# Patient Record
Sex: Female | Born: 1990 | Race: Black or African American | Hispanic: No | Marital: Single | Smoking: Never smoker
Health system: Southern US, Community
[De-identification: ages and names within clinical notes are randomized; demographics above are authoritative.]

## PROBLEM LIST (undated history)

## (undated) ENCOUNTER — Inpatient Hospital Stay (HOSPITAL_COMMUNITY): Payer: Self-pay

## (undated) DIAGNOSIS — E669 Obesity, unspecified: Secondary | ICD-10-CM

## (undated) DIAGNOSIS — N760 Acute vaginitis: Secondary | ICD-10-CM

## (undated) DIAGNOSIS — N83209 Unspecified ovarian cyst, unspecified side: Secondary | ICD-10-CM

## (undated) DIAGNOSIS — F32A Depression, unspecified: Secondary | ICD-10-CM

## (undated) DIAGNOSIS — B9689 Other specified bacterial agents as the cause of diseases classified elsewhere: Secondary | ICD-10-CM

## (undated) DIAGNOSIS — N390 Urinary tract infection, site not specified: Secondary | ICD-10-CM

## (undated) DIAGNOSIS — A549 Gonococcal infection, unspecified: Secondary | ICD-10-CM

## (undated) DIAGNOSIS — B999 Unspecified infectious disease: Secondary | ICD-10-CM

## (undated) DIAGNOSIS — A599 Trichomoniasis, unspecified: Secondary | ICD-10-CM

## (undated) DIAGNOSIS — F329 Major depressive disorder, single episode, unspecified: Secondary | ICD-10-CM

## (undated) DIAGNOSIS — L309 Dermatitis, unspecified: Secondary | ICD-10-CM

## (undated) DIAGNOSIS — A6000 Herpesviral infection of urogenital system, unspecified: Secondary | ICD-10-CM

## (undated) DIAGNOSIS — O009 Unspecified ectopic pregnancy without intrauterine pregnancy: Secondary | ICD-10-CM

## (undated) DIAGNOSIS — A609 Anogenital herpesviral infection, unspecified: Secondary | ICD-10-CM

## (undated) HISTORY — PX: UNILATERAL SALPINGECTOMY: SHX6160

## (undated) HISTORY — PX: THERAPEUTIC ABORTION: SHX798

## (undated) HISTORY — DX: Obesity, unspecified: E66.9

---
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
## 2009-04-08 ENCOUNTER — Emergency Department (HOSPITAL_COMMUNITY): Admission: EM | Admit: 2009-04-08 | Discharge: 2009-04-08 | Payer: Self-pay | Admitting: Family Medicine

## 2009-09-02 ENCOUNTER — Inpatient Hospital Stay (HOSPITAL_COMMUNITY): Admission: AD | Admit: 2009-09-02 | Discharge: 2009-09-02 | Payer: Self-pay | Admitting: Obstetrics and Gynecology

## 2009-09-03 ENCOUNTER — Emergency Department (HOSPITAL_COMMUNITY): Admission: EM | Admit: 2009-09-03 | Discharge: 2009-09-03 | Payer: Self-pay | Admitting: Emergency Medicine

## 2009-09-03 ENCOUNTER — Encounter: Payer: Self-pay | Admitting: Obstetrics and Gynecology

## 2010-01-04 ENCOUNTER — Inpatient Hospital Stay (HOSPITAL_COMMUNITY): Admission: AD | Admit: 2010-01-04 | Discharge: 2010-01-08 | Payer: Self-pay | Admitting: Obstetrics and Gynecology

## 2010-09-04 ENCOUNTER — Ambulatory Visit: Payer: Self-pay | Admitting: Nurse Practitioner

## 2010-09-04 ENCOUNTER — Inpatient Hospital Stay (HOSPITAL_COMMUNITY): Admission: AD | Admit: 2010-09-04 | Discharge: 2010-09-04 | Payer: Self-pay | Admitting: Obstetrics and Gynecology

## 2010-09-11 ENCOUNTER — Inpatient Hospital Stay (HOSPITAL_COMMUNITY): Admission: AD | Admit: 2010-09-11 | Discharge: 2010-09-12 | Payer: Self-pay | Admitting: Obstetrics & Gynecology

## 2010-09-12 ENCOUNTER — Ambulatory Visit: Payer: Self-pay | Admitting: Gynecology

## 2010-09-12 ENCOUNTER — Inpatient Hospital Stay (HOSPITAL_COMMUNITY): Admission: AD | Admit: 2010-09-12 | Discharge: 2010-09-12 | Payer: Self-pay | Admitting: Obstetrics and Gynecology

## 2011-03-10 LAB — CBC
HCT: 37.1 % (ref 36.0–46.0)
Hemoglobin: 12.7 g/dL (ref 12.0–15.0)
MCHC: 34.3 g/dL (ref 30.0–36.0)
MCV: 89.4 fL (ref 78.0–100.0)
RBC: 4.15 MIL/uL (ref 3.87–5.11)
RDW: 13.1 % (ref 11.5–15.5)
WBC: 10 10*3/uL (ref 4.0–10.5)

## 2011-03-10 LAB — URINALYSIS, ROUTINE W REFLEX MICROSCOPIC
Bilirubin Urine: NEGATIVE
Hgb urine dipstick: NEGATIVE
Ketones, ur: NEGATIVE mg/dL
Nitrite: NEGATIVE
pH: 6 (ref 5.0–8.0)

## 2011-03-10 LAB — URINE MICROSCOPIC-ADD ON

## 2011-03-10 LAB — WET PREP, GENITAL
Trich, Wet Prep: NONE SEEN
Yeast Wet Prep HPF POC: NONE SEEN

## 2011-03-10 LAB — GC/CHLAMYDIA PROBE AMP, GENITAL: GC Probe Amp, Genital: NEGATIVE

## 2011-03-10 LAB — POCT PREGNANCY, URINE
Preg Test, Ur: NEGATIVE
Preg Test, Ur: NEGATIVE

## 2011-03-13 LAB — COMPREHENSIVE METABOLIC PANEL
ALT: 22 U/L (ref 0–35)
AST: 22 U/L (ref 0–37)
Alkaline Phosphatase: 137 U/L — ABNORMAL HIGH (ref 39–117)
CO2: 24 mEq/L (ref 19–32)
Calcium: 9.3 mg/dL (ref 8.4–10.5)
Chloride: 105 mEq/L (ref 96–112)
GFR calc Af Amer: >60 mL/min (ref >60)
Glucose, Bld: 75 mg/dL (ref 70–99)
Potassium: 4.4 mEq/L (ref 3.5–5.1)
Total Bilirubin: 0.5 mg/dL (ref 0.3–1.2)
Total Protein: 6.1 g/dL (ref 6.0–8.3)

## 2011-03-13 LAB — CBC
MCHC: 33.9 g/dL (ref 30.0–36.0)
Platelets: 189 10*3/uL (ref 150–400)
Platelets: 230 10*3/uL (ref 150–400)
RDW: 13.9 % (ref 11.5–15.5)

## 2011-03-13 LAB — LACTATE DEHYDROGENASE: LDH: 145 U/L (ref 94–250)

## 2011-03-13 LAB — RPR: RPR Ser Ql: NONREACTIVE

## 2011-03-22 ENCOUNTER — Observation Stay (HOSPITAL_COMMUNITY)
Admission: EM | Admit: 2011-03-22 | Discharge: 2011-03-23 | Disposition: A | Discharge status: Home / Self Care | Payer: Self-pay | Attending: Emergency Medicine | Admitting: Emergency Medicine

## 2011-03-22 ENCOUNTER — Emergency Department (HOSPITAL_COMMUNITY): Payer: Self-pay

## 2011-03-22 ENCOUNTER — Encounter (HOSPITAL_COMMUNITY): Payer: Self-pay | Admitting: Radiology

## 2011-03-22 DIAGNOSIS — R110 Nausea: Secondary | ICD-10-CM | POA: Insufficient documentation

## 2011-03-22 DIAGNOSIS — R197 Diarrhea, unspecified: Secondary | ICD-10-CM | POA: Insufficient documentation

## 2011-03-22 DIAGNOSIS — R509 Fever, unspecified: Secondary | ICD-10-CM | POA: Insufficient documentation

## 2011-03-22 DIAGNOSIS — R109 Unspecified abdominal pain: Secondary | ICD-10-CM | POA: Insufficient documentation

## 2011-03-22 DIAGNOSIS — N12 Tubulo-interstitial nephritis, not specified as acute or chronic: Principal | ICD-10-CM | POA: Insufficient documentation

## 2011-03-22 LAB — URINALYSIS, ROUTINE W REFLEX MICROSCOPIC
Ketones, ur: 15 mg/dL — AB
Urobilinogen, UA: 4 mg/dL — ABNORMAL HIGH (ref 0.0–1.0)

## 2011-03-22 LAB — DIFFERENTIAL
Basophils Absolute: 0 10*3/uL (ref 0.0–0.1)
Basophils Relative: 0 % (ref 0–1)
Eosinophils Absolute: 0 10*3/uL (ref 0.0–0.7)
Eosinophils Relative: 0 % (ref 0–5)
Monocytes Absolute: 1.1 10*3/uL — ABNORMAL HIGH (ref 0.1–1.0)
Monocytes Relative: 4 % (ref 3–12)
Neutrophils Relative %: 87 % — ABNORMAL HIGH (ref 43–77)

## 2011-03-22 LAB — BASIC METABOLIC PANEL
CO2: 24 mEq/L (ref 19–32)
Calcium: 9.1 mg/dL (ref 8.4–10.5)
Glucose, Bld: 102 mg/dL — ABNORMAL HIGH (ref 70–99)
Sodium: 136 mEq/L (ref 135–145)

## 2011-03-22 LAB — CBC
MCH: 30.1 pg (ref 26.0–34.0)
RBC: 4.25 MIL/uL (ref 3.87–5.11)
RDW: 13.2 % (ref 11.5–15.5)
WBC: 26.5 10*3/uL — ABNORMAL HIGH (ref 4.0–10.5)

## 2011-03-22 LAB — URINE MICROSCOPIC-ADD ON

## 2011-03-22 LAB — POCT PREGNANCY, URINE: Preg Test, Ur: NEGATIVE

## 2011-03-22 MED ORDER — IOHEXOL 300 MG/ML  SOLN
100.0000 mL | Freq: Once | INTRAMUSCULAR | Status: AC | PRN
  Administered 2011-03-22: 100 mL via INTRAVENOUS

## 2011-03-23 LAB — WET PREP, GENITAL: Clue Cells Wet Prep HPF POC: NONE SEEN

## 2011-03-24 LAB — GC/CHLAMYDIA PROBE AMP, GENITAL
Chlamydia, DNA Probe: NEGATIVE
GC Probe Amp, Genital: POSITIVE — AB

## 2011-03-26 ENCOUNTER — Emergency Department (HOSPITAL_COMMUNITY)
Admission: EM | Admit: 2011-03-26 | Discharge: 2011-03-27 | Disposition: A | Discharge status: Home / Self Care | Payer: Self-pay | Attending: Emergency Medicine | Admitting: Emergency Medicine

## 2011-03-26 DIAGNOSIS — R1084 Generalized abdominal pain: Secondary | ICD-10-CM | POA: Insufficient documentation

## 2011-03-26 DIAGNOSIS — A6000 Herpesviral infection of urogenital system, unspecified: Secondary | ICD-10-CM | POA: Insufficient documentation

## 2011-03-26 DIAGNOSIS — N949 Unspecified condition associated with female genital organs and menstrual cycle: Secondary | ICD-10-CM | POA: Insufficient documentation

## 2011-03-26 DIAGNOSIS — L293 Anogenital pruritus, unspecified: Secondary | ICD-10-CM | POA: Insufficient documentation

## 2011-03-26 DIAGNOSIS — F411 Generalized anxiety disorder: Secondary | ICD-10-CM | POA: Insufficient documentation

## 2011-03-26 DIAGNOSIS — N898 Other specified noninflammatory disorders of vagina: Secondary | ICD-10-CM | POA: Insufficient documentation

## 2011-03-26 DIAGNOSIS — R197 Diarrhea, unspecified: Secondary | ICD-10-CM | POA: Insufficient documentation

## 2011-03-26 DIAGNOSIS — R300 Dysuria: Secondary | ICD-10-CM | POA: Insufficient documentation

## 2011-03-26 LAB — URINALYSIS, ROUTINE W REFLEX MICROSCOPIC
Ketones, ur: 15 mg/dL — AB
Nitrite: NEGATIVE
Protein, ur: NEGATIVE mg/dL
Specific Gravity, Urine: 1.032 — ABNORMAL HIGH (ref 1.005–1.030)
Urobilinogen, UA: 1 mg/dL (ref 0.0–1.0)

## 2011-03-26 LAB — CBC
Hemoglobin: 11.6 g/dL — ABNORMAL LOW (ref 12.0–15.0)
MCH: 30 pg (ref 26.0–34.0)
MCV: 86.3 fL (ref 78.0–100.0)
RBC: 3.87 MIL/uL (ref 3.87–5.11)
WBC: 11 10*3/uL — ABNORMAL HIGH (ref 4.0–10.5)

## 2011-03-26 LAB — BASIC METABOLIC PANEL
CO2: 23 mEq/L (ref 19–32)
Chloride: 103 mEq/L (ref 96–112)
GFR calc Af Amer: >60 mL/min (ref >60)
Sodium: 134 mEq/L — ABNORMAL LOW (ref 135–145)

## 2011-03-26 LAB — POCT PREGNANCY, URINE: Preg Test, Ur: NEGATIVE

## 2011-03-27 LAB — DIFFERENTIAL
Basophils Absolute: 0 10*3/uL (ref 0.0–0.1)
Basophils Relative: 0 % (ref 0–1)
Lymphocytes Relative: 28 % (ref 12–46)
Monocytes Relative: 7 % (ref 3–12)
Neutro Abs: 6.9 10*3/uL (ref 1.7–7.7)
Neutrophils Relative %: 63 % (ref 43–77)

## 2011-03-28 LAB — HERPES SIMPLEX VIRUS CULTURE: Culture: DETECTED

## 2011-04-01 LAB — CBC
HCT: 36.4 % (ref 36.0–46.0)
MCHC: 34.7 g/dL (ref 30.0–36.0)
MCV: 93 fL (ref 78.0–100.0)
MCV: 93.5 fL (ref 78.0–100.0)
Platelets: 234 10*3/uL (ref 150–400)
RBC: 4 MIL/uL (ref 3.87–5.11)
RBC: 3.89 MIL/uL (ref 3.87–5.11)
WBC: 14.6 10*3/uL — ABNORMAL HIGH (ref 4.0–10.5)

## 2011-04-01 LAB — BASIC METABOLIC PANEL
BUN: 2 mg/dL — ABNORMAL LOW (ref 6–23)
Chloride: 105 mEq/L (ref 96–112)
Creatinine, Ser: 0.59 mg/dL (ref 0.4–1.2)
GFR calc Af Amer: >60 mL/min (ref >60)
GFR calc non Af Amer: >60 mL/min (ref >60)

## 2011-04-01 LAB — COMPREHENSIVE METABOLIC PANEL
AST: 25 U/L (ref 0–37)
BUN: 2 mg/dL — ABNORMAL LOW (ref 6–23)
CO2: 22 mEq/L (ref 19–32)
Calcium: 8.5 mg/dL (ref 8.4–10.5)
Creatinine, Ser: 0.52 mg/dL (ref 0.4–1.2)
GFR calc Af Amer: >60 mL/min (ref >60)
GFR calc non Af Amer: >60 mL/min (ref >60)
Glucose, Bld: 102 mg/dL — ABNORMAL HIGH (ref 70–99)

## 2011-04-01 LAB — URINE MICROSCOPIC-ADD ON

## 2011-04-01 LAB — URINALYSIS, ROUTINE W REFLEX MICROSCOPIC
Bilirubin Urine: NEGATIVE
Glucose, UA: NEGATIVE mg/dL
Hgb urine dipstick: NEGATIVE
Hgb urine dipstick: NEGATIVE
Nitrite: NEGATIVE
Specific Gravity, Urine: >1.03 — ABNORMAL HIGH (ref 1.005–1.030)
Urobilinogen, UA: 0.2 mg/dL (ref 0.0–1.0)
Urobilinogen, UA: 4 mg/dL — ABNORMAL HIGH (ref 0.0–1.0)
pH: 6 (ref 5.0–8.0)

## 2011-04-01 LAB — DIFFERENTIAL
Eosinophils Absolute: 0 10*3/uL (ref 0.0–0.7)
Lymphocytes Relative: 9 % — ABNORMAL LOW (ref 12–46)
Lymphs Abs: 1.4 10*3/uL (ref 0.7–4.0)
Monocytes Relative: 5 % (ref 3–12)
Neutro Abs: 12.5 10*3/uL — ABNORMAL HIGH (ref 1.7–7.7)
Neutrophils Relative %: 85 % — ABNORMAL HIGH (ref 43–77)

## 2011-04-01 LAB — POCT CARDIAC MARKERS
CKMB, poc: <1 ng/mL — ABNORMAL LOW (ref 1.0–8.0)
Myoglobin, poc: 32.4 ng/mL (ref 12–200)
Myoglobin, poc: 35.9 ng/mL (ref 12–200)
Troponin i, poc: <0.05 ng/mL (ref 0.00–0.09)

## 2011-04-06 LAB — POCT I-STAT, CHEM 8
Chloride: 104 mEq/L (ref 96–112)
Glucose, Bld: 85 mg/dL (ref 70–99)
HCT: 42 % (ref 36.0–46.0)
Hemoglobin: 14.3 g/dL (ref 12.0–15.0)
Potassium: 3.8 mEq/L (ref 3.5–5.1)
Sodium: 138 mEq/L (ref 135–145)

## 2011-04-06 LAB — POCT URINALYSIS DIP (DEVICE)
Bilirubin Urine: NEGATIVE
Glucose, UA: NEGATIVE mg/dL
Ketones, ur: NEGATIVE mg/dL

## 2011-04-06 LAB — WET PREP, GENITAL: Trich, Wet Prep: NONE SEEN

## 2011-04-06 LAB — URINE CULTURE: Colony Count: 55000

## 2011-04-06 LAB — POCT PREGNANCY, URINE: Preg Test, Ur: NEGATIVE

## 2011-04-11 LAB — URINALYSIS, ROUTINE W REFLEX MICROSCOPIC
Bilirubin Urine: NEGATIVE
Nitrite: NEGATIVE
Specific Gravity, Urine: 1.02 (ref 1.005–1.030)
Urobilinogen, UA: 0.2 mg/dL (ref 0.0–1.0)
pH: 5 (ref 5.0–8.0)

## 2011-04-11 LAB — BASIC METABOLIC PANEL
BUN: 7 mg/dL (ref 6–23)
Chloride: 108 mEq/L (ref 96–112)
GFR calc Af Amer: >60 mL/min (ref >60)
GFR calc non Af Amer: >60 mL/min (ref >60)
Potassium: 4.2 mEq/L (ref 3.5–5.1)
Sodium: 142 mEq/L (ref 135–145)

## 2011-04-11 LAB — URINE MICROSCOPIC-ADD ON

## 2011-04-11 LAB — PREGNANCY, URINE: Preg Test, Ur: NEGATIVE

## 2011-11-07 ENCOUNTER — Emergency Department (HOSPITAL_COMMUNITY)
Admission: EM | Admit: 2011-11-07 | Discharge: 2011-11-07 | Disposition: A | Discharge status: Home / Self Care | Payer: Self-pay

## 2012-02-18 ENCOUNTER — Encounter (HOSPITAL_COMMUNITY): Payer: Self-pay | Admitting: Registered Nurse

## 2012-02-18 ENCOUNTER — Encounter (HOSPITAL_COMMUNITY)
Admission: AD | Disposition: A | Discharge status: Home / Self Care | Payer: Self-pay | Source: Ambulatory Visit | Attending: Obstetrics and Gynecology

## 2012-02-18 ENCOUNTER — Other Ambulatory Visit: Payer: Self-pay | Admitting: Obstetrics and Gynecology

## 2012-02-18 ENCOUNTER — Inpatient Hospital Stay (HOSPITAL_COMMUNITY): Payer: Medicaid Other

## 2012-02-18 ENCOUNTER — Ambulatory Visit (HOSPITAL_COMMUNITY)
Admission: AD | Admit: 2012-02-18 | Discharge: 2012-02-19 | Disposition: A | Discharge status: Home / Self Care | Payer: Medicaid Other | Source: Ambulatory Visit | Attending: Obstetrics and Gynecology | Admitting: Obstetrics and Gynecology

## 2012-02-18 ENCOUNTER — Encounter (HOSPITAL_COMMUNITY): Payer: Self-pay | Admitting: *Deleted

## 2012-02-18 ENCOUNTER — Inpatient Hospital Stay (HOSPITAL_COMMUNITY): Payer: Medicaid Other | Admitting: Registered Nurse

## 2012-02-18 DIAGNOSIS — K661 Hemoperitoneum: Secondary | ICD-10-CM

## 2012-02-18 DIAGNOSIS — O00109 Unspecified tubal pregnancy without intrauterine pregnancy: Principal | ICD-10-CM

## 2012-02-18 HISTORY — PX: LAPAROSCOPY: SHX197

## 2012-02-18 LAB — URINALYSIS, ROUTINE W REFLEX MICROSCOPIC
Glucose, UA: NEGATIVE mg/dL
Hgb urine dipstick: NEGATIVE
Ketones, ur: NEGATIVE mg/dL
Protein, ur: NEGATIVE mg/dL
pH: 8 (ref 5.0–8.0)

## 2012-02-18 LAB — CBC
Hemoglobin: 13.3 g/dL (ref 12.0–15.0)
MCH: 32 pg (ref 26.0–34.0)
MCHC: 34.9 g/dL (ref 30.0–36.0)
MCV: 91.8 fL (ref 78.0–100.0)
Platelets: 314 10*3/uL (ref 150–400)
RBC: 4.15 MIL/uL (ref 3.87–5.11)

## 2012-02-18 LAB — WET PREP, GENITAL: Trich, Wet Prep: NONE SEEN

## 2012-02-18 SURGERY — LAPAROSCOPY OPERATIVE
Anesthesia: General | Site: Abdomen | Wound class: Clean Contaminated

## 2012-02-18 MED ORDER — DEXAMETHASONE SODIUM PHOSPHATE 10 MG/ML IJ SOLN
INTRAMUSCULAR | Status: DC | PRN
  Administered 2012-02-18: 10 mg via INTRAVENOUS

## 2012-02-18 MED ORDER — ONDANSETRON HCL 4 MG/2ML IJ SOLN
INTRAMUSCULAR | Status: DC | PRN
  Administered 2012-02-18: 4 mg via INTRAVENOUS

## 2012-02-18 MED ORDER — SUCCINYLCHOLINE CHLORIDE 20 MG/ML IJ SOLN
INTRAMUSCULAR | Status: DC | PRN
  Administered 2012-02-18: 120 mg via INTRAVENOUS

## 2012-02-18 MED ORDER — LIDOCAINE HCL (CARDIAC) 20 MG/ML IV SOLN
INTRAVENOUS | Status: DC | PRN
  Administered 2012-02-18: 100 mg via INTRAVENOUS

## 2012-02-18 MED ORDER — GLYCOPYRROLATE 0.2 MG/ML IJ SOLN
INTRAMUSCULAR | Status: DC | PRN
  Administered 2012-02-18: .4 mg via INTRAVENOUS

## 2012-02-18 MED ORDER — CITRIC ACID-SODIUM CITRATE 334-500 MG/5ML PO SOLN
30.0000 mL | Freq: Once | ORAL | Status: AC
  Administered 2012-02-18: 30 mL via ORAL
  Filled 2012-02-18: qty 15

## 2012-02-18 MED ORDER — FAMOTIDINE IN NACL 20-0.9 MG/50ML-% IV SOLN
20.0000 mg | Freq: Once | INTRAVENOUS | Status: AC
  Administered 2012-02-18: 20 mg via INTRAVENOUS
  Filled 2012-02-18: qty 50

## 2012-02-18 MED ORDER — LACTATED RINGERS IR SOLN
Status: DC | PRN
  Administered 2012-02-18: 200 mL

## 2012-02-18 MED ORDER — PROPOFOL 10 MG/ML IV EMUL
INTRAVENOUS | Status: DC | PRN
  Administered 2012-02-18: 200 mg via INTRAVENOUS

## 2012-02-18 MED ORDER — KETOROLAC TROMETHAMINE 30 MG/ML IJ SOLN
INTRAMUSCULAR | Status: DC | PRN
  Administered 2012-02-18: 30 mg via INTRAVENOUS

## 2012-02-18 MED ORDER — BUPIVACAINE HCL (PF) 0.25 % IJ SOLN
INTRAMUSCULAR | Status: DC | PRN
  Administered 2012-02-18: 10 mL
  Administered 2012-02-18: 5 mL

## 2012-02-18 MED ORDER — OXYCODONE-ACETAMINOPHEN 5-325 MG PO TABS: 1.0000 | ORAL_TABLET | ORAL | Status: AC | PRN

## 2012-02-18 MED ORDER — NEOSTIGMINE METHYLSULFATE 1 MG/ML IJ SOLN
INTRAMUSCULAR | Status: DC | PRN
  Administered 2012-02-18: 3 mg via INTRAVENOUS

## 2012-02-18 MED ORDER — IBUPROFEN 600 MG PO TABS: 600.0000 mg | ORAL_TABLET | Freq: Four times a day (QID) | ORAL | Status: AC | PRN

## 2012-02-18 MED ORDER — MIDAZOLAM HCL 5 MG/5ML IJ SOLN
INTRAMUSCULAR | Status: DC | PRN
  Administered 2012-02-18: 2 mg via INTRAVENOUS

## 2012-02-18 MED ORDER — FENTANYL CITRATE 0.05 MG/ML IJ SOLN
INTRAMUSCULAR | Status: DC | PRN
  Administered 2012-02-18: 50 ug via INTRAVENOUS
  Administered 2012-02-18: 100 ug via INTRAVENOUS
  Administered 2012-02-18 (×2): 50 ug via INTRAVENOUS

## 2012-02-18 MED ORDER — ROCURONIUM BROMIDE 100 MG/10ML IV SOLN
INTRAVENOUS | Status: DC | PRN
  Administered 2012-02-18: 30 mg via INTRAVENOUS

## 2012-02-18 MED ORDER — LACTATED RINGERS IV SOLN
INTRAVENOUS | Status: DC
  Administered 2012-02-18 (×2): via INTRAVENOUS

## 2012-02-18 SURGICAL SUPPLY — 23 items
CHLORAPREP W/TINT 26ML (MISCELLANEOUS) ×2 IMPLANT
DERMABOND ADVANCED (GAUZE/BANDAGES/DRESSINGS) ×1
DERMABOND ADVANCED .7 DNX12 (GAUZE/BANDAGES/DRESSINGS) ×1 IMPLANT
ELECT REM PT RETURN 9FT ADLT (ELECTROSURGICAL) ×2
ELECTRODE REM PT RTRN 9FT ADLT (ELECTROSURGICAL) ×1 IMPLANT
GLOVE BIOGEL PI IND STRL 6.5 (GLOVE) ×2 IMPLANT
GLOVE BIOGEL PI INDICATOR 6.5 (GLOVE) ×2
GLOVE SURG SS PI 6.0 STRL IVOR (GLOVE) ×2 IMPLANT
GOWN PREVENTION PLUS LG XLONG (DISPOSABLE) ×4 IMPLANT
NS IRRIG 1000ML POUR BTL (IV SOLUTION) ×2 IMPLANT
PACK LAPAROSCOPY BASIN (CUSTOM PROCEDURE TRAY) ×2 IMPLANT
POUCH SPECIMEN RETRIEVAL 10MM (ENDOMECHANICALS) ×2 IMPLANT
PROTECTOR NERVE ULNAR (MISCELLANEOUS) ×2 IMPLANT
SEALER TISSUE G2 CVD JAW 35 (ENDOMECHANICALS) IMPLANT
SEALER TISSUE G2 CVD JAW 45CM (ENDOMECHANICALS) ×2 IMPLANT
SET IRRIG TUBING LAPAROSCOPIC (IRRIGATION / IRRIGATOR) ×2 IMPLANT
SUT MNCRL AB 4-0 PS2 18 (SUTURE) ×2 IMPLANT
SUT VICRYL 0 UR6 27IN ABS (SUTURE) ×4 IMPLANT
TOWEL OR 17X24 6PK STRL BLUE (TOWEL DISPOSABLE) ×4 IMPLANT
TROCAR BALLN 12MMX100 BLUNT (TROCAR) ×2 IMPLANT
TROCAR XCEL NON-BLD 11X100MML (ENDOMECHANICALS) ×2 IMPLANT
TROCAR XCEL NON-BLD 5MMX100MML (ENDOMECHANICALS) ×4 IMPLANT
WATER STERILE IRR 1000ML POUR (IV SOLUTION) ×2 IMPLANT

## 2012-02-18 NOTE — Op Note (Addendum)
Kathy Hill PROCEDURE DATE: 02/18/2012  PREOPERATIVE DIAGNOSIS: Ruptured ectopic pregnancy POSTOPERATIVE DIAGNOSIS: Ruptured left fallopian tube ectopic pregnancy PROCEDURE: Laparoscopic left salpingectomy and removal of ectopic pregnancy, lysis of adhesion SURGEON:  Dr. Catalina Antigua ASSISTANT: none ANESTHESIOLOGIST: Sandrea Hughs., MD Sandrea Hughs., MD - Anesthesiologist Doreene Burke, CRNA - CRNA  INDICATIONS: 21 y.o. [phone] at Unknown here for with ruptured ectopic pregnancy. On exam, she had stable vital signs, and an acute abdomen. Hgb  Lab Results  Component Value Date   HGB 13.3 02/18/2012   , blood type O POS . Patient was counseled regarding need for laparoscopic salpingectomy. Risks of surgery including bleeding which may require transfusion or reoperation, infection, injury to bowel or other surrounding organs, need for additional procedures including laparotomy and other postoperative/anesthesia complications were explained to patient.  Written informed consent was obtained.  FINDINGS:  Moderate amount of hemoperitoneum estimated to be about 100 of blood and clots.  Dilated left fallopian tube containing ectopic gestation. Small normal appearing uterus, normal right fallopian tube, right ovary and left ovary. Extensive pelvic adhesion between sigmoid and left fallopian tube as well as right tube and ovary to appendix. Adhesion also visualized between uterus and cul-de sac   ANESTHESIA: General INTRAVENOUS FLUIDS: .1400 ml ESTIMATED BLOOD LOSS:  * No blood loss amount entered * 50 ml URINE OUTPUT: 100 ml SPECIMENS: left fallopian tube containing ectopic gestation COMPLICATIONS: None immediate  PROCEDURE IN DETAIL:  The patient was taken to the operating room where general anesthesia was administered and was found to be adequate.  She was placed in the dorsal lithotomy position, and was prepped and draped in a sterile manner.  A Foley catheter was  inserted into her bladder and attached to Kathy Hill drainage and a uterine manipulator was then advanced into the uterus .  After an adequate timeout was performed, attention was then turned to the patient's abdomen where a 10-mm skin incision was made on the umbilical fold.  The underlying fascia was grasped with kocher clamps and incised. The fascia was tagged with 0-Vicryl. The peritoneum was grasped with kelly clamps and entered sharply with Metzenbaum scissors.  The 10-mm trocar and sleeve were then advanced without difficulty into the abdomen where intraabdominal placement was confirmed by the laparoscope. A survey of the patient's pelvis and abdomen revealed the findings as above.  Two 5-mm ports were placed under direct visualization in the right lower quadrant: one 2 cm above and 2 cm medial to the superior iliac spine and the other 5 cm above the other.  The 10-mm Nezhat suction irrigator was then used to suction the hemoperitoneum and irrigate the pelvis.  Attention was then turned to the left fallopian tube which was grasped and ligated from the underlying mesosalpinx and uterine attachment using the Enseal instrument.  Good hemostasis was noted.  The specimen was placed in an EndoCatch bag and removed from the abdomen intact.  Lysis of adhesion was then performed. The abdomen was desufflated, and all instruments were removed.  The fascial incision at the 10-mm site were reapproximated with 0 Vicryl figure-of-eight stiches; and all skin incisions were closed with a 3-0 Vicryl subcuticular stitch. The patient tolerated the procedures well.  All instruments, needles, and sponge counts were correct x 2. The patient was taken to the recovery room in stable condition.   The patient will be discharged to home as per PACU criteria.  Routine postoperative instructions given.  She was prescribed Percocet, Ibuprofen and  Colace.  She will follow up in the clinic in 2 weeks for postoperative evaluation .

## 2012-02-18 NOTE — Progress Notes (Signed)
States had one episode of bleeding this am while standing to brush teeth but none since.

## 2012-02-18 NOTE — Progress Notes (Signed)
Henrietta Hoover PA in to see pt. Plan of care discussed

## 2012-02-18 NOTE — ED Notes (Signed)
2217 Pt to OR via stretcher.

## 2012-02-18 NOTE — Progress Notes (Signed)
Last month found out was preg. Wk later bled a lot. Called doctor and told probably had SAB. Bled with clots for 3 wks and then stopped. Then had spotting off and on. Now still spotting and having pain RLQ. Took HPT and positive so unsure what is going on

## 2012-02-18 NOTE — ED Provider Notes (Signed)
History   Pt presents today c/o vag bleeding in preg. She states she found out she was pregnant last month but then had heavy bleeding for about 3wks. She was told by her family physician to come for an appt but she states she could not afford the visit. She thought she a miscarriage until she took another preg test today after a single episode of vag bleeding. She also reports RLQ pain that has worsened. She denies fever, vag irritation, or any other sx at this time.  Chief Complaint  Patient presents with  . Abdominal Pain   HPI  OB History    Grav Para Term Preterm Abortions TAB SAB Ect Mult Living   2 1 1  0 1 1 0 0 0 1      History reviewed. No pertinent past medical history.  Past Surgical History  Procedure Date  . Cesarean section     Family History  Problem Relation Age of Onset  . Anesthesia problems Neg Hx   . Hypotension Neg Hx   . Malignant hyperthermia Neg Hx   . Pseudochol deficiency Neg Hx     History  Substance Use Topics  . Smoking status: Never Smoker   . Smokeless tobacco: Not on file  . Alcohol Use: No    Allergies: Not on File  No prescriptions prior to admission    Review of Systems  Constitutional: Negative for fever and chills.  Eyes: Negative for blurred vision and double vision.  Respiratory: Negative for cough, hemoptysis, sputum production, shortness of breath and wheezing.   Cardiovascular: Negative for chest pain and palpitations.  Gastrointestinal: Positive for abdominal pain. Negative for nausea, vomiting, diarrhea and constipation.  Genitourinary: Negative for dysuria, urgency, frequency and hematuria.  Neurological: Negative for dizziness and headaches.  Psychiatric/Behavioral: Negative for depression and suicidal ideas.   Physical Exam   Blood pressure 121/80, pulse 114, temperature 98.6 F (37 C), temperature source Oral, resp. rate 20, height 5\' 6"  (1.676 m), weight 216 lb 6.4 oz (98.158 kg).  Physical Exam  Nursing note  and vitals reviewed. Constitutional: She is oriented to person, place, and time. She appears well-developed and well-nourished. No distress.  HENT:  Head: Normocephalic and atraumatic.  Eyes: EOM are normal. Pupils are equal, round, and reactive to light.  GI: Soft. She exhibits no distension and no mass. There is tenderness. There is no rebound and no guarding.  Genitourinary: No bleeding around the vagina. No vaginal discharge found.       Cervix Lg/closed. Uterus and both adnexa slightly tender to palpation. No obvious masses but exam was difficult secondary to increased body habitus.   Neurological: She is alert and oriented to person, place, and time.  Skin: Skin is warm and dry. She is not diaphoretic.  Psychiatric: She has a normal mood and affect. Her behavior is normal. Judgment and thought content normal.    MAU Course  Procedures  Wet prep and GC/Chlamydia cultures done.  Results for orders placed during the hospital encounter of 02/18/12 (from the past 24 hour(s))  URINALYSIS, ROUTINE W REFLEX MICROSCOPIC     Status: Abnormal   Collection Time   02/18/12  7:54 PM      Component Value Range   Color, Urine YELLOW  YELLOW    APPearance CLOUDY (*) CLEAR    Specific Gravity, Urine 1.010  1.005 - 1.030    pH 8.0  5.0 - 8.0    Glucose, UA NEGATIVE  NEGATIVE (mg/dL)  Hgb urine dipstick NEGATIVE  NEGATIVE    Bilirubin Urine NEGATIVE  NEGATIVE    Ketones, ur NEGATIVE  NEGATIVE (mg/dL)   Protein, ur NEGATIVE  NEGATIVE (mg/dL)   Urobilinogen, UA 1.0  0.0 - 1.0 (mg/dL)   Nitrite NEGATIVE  NEGATIVE    Leukocytes, UA TRACE (*) NEGATIVE   URINE MICROSCOPIC-ADD ON     Status: Abnormal   Collection Time   02/18/12  7:54 PM      Component Value Range   Squamous Epithelial / LPF MANY (*) RARE    WBC, UA 7-10  <3 (WBC/hpf)   Bacteria, UA RARE  RARE   POCT PREGNANCY, URINE     Status: Abnormal   Collection Time   02/18/12  8:01 PM      Component Value Range   Preg Test, Ur POSITIVE  (*) NEGATIVE   WET PREP, GENITAL     Status: Abnormal   Collection Time   02/18/12  8:15 PM      Component Value Range   Yeast Wet Prep HPF POC NONE SEEN  NONE SEEN    Trich, Wet Prep NONE SEEN  NONE SEEN    Clue Cells Wet Prep HPF POC FEW (*) NONE SEEN    WBC, Wet Prep HPF POC FEW (*) NONE SEEN   HCG, QUANTITATIVE, PREGNANCY     Status: Abnormal   Collection Time   02/18/12  8:30 PM      Component Value Range   hCG, Beta Chain, Quant, S 2158 (*) <5 (mIU/mL)  ABO/RH     Status: Normal (Preliminary result)   Collection Time   02/18/12  8:30 PM      Component Value Range   ABO/RH(D) O POS    CBC     Status: Abnormal   Collection Time   02/18/12  8:30 PM      Component Value Range   WBC 16.7 (*) 4.0 - 10.5 (K/uL)   RBC 4.15  3.87 - 5.11 (MIL/uL)   Hemoglobin 13.3  12.0 - 15.0 (g/dL)   HCT 16.1  09.6 - 04.5 (%)   MCV 91.8  78.0 - 100.0 (fL)   MCH 32.0  26.0 - 34.0 (pg)   MCHC 34.9  30.0 - 36.0 (g/dL)   RDW 40.9  81.1 - 91.4 (%)   Platelets 314  150 - 400 (K/uL)   US Ob Comp Less 14 Wks  02/18/2012  *RADIOLOGY REPORT*  Clinical Data: 21 year old female with positive pregnancy test and several weeks of bleeding.  Recurrent bleeding and some pain today. Quantitative beta HCG pending.  OBSTETRIC <14 WK [place] AND TRANSVAGINAL OB US  Technique:  Both transabdominal and transvaginal ultrasound examinations were performed for complete evaluation of the gestation as well as the maternal uterus, adnexal regions, and pelvic cul-de-sac.  Transvaginal technique was performed to assess early pregnancy.  Comparison:  None relevant.  Intrauterine gestational sac:  None. Yolk sac: None. Embryo: None. Cardiac Activity: None.  Maternal uterus/adnexae: A small to moderate volume of fluid with a echoes in the pelvis. Myometrium and endometrium appear homogeneous.  The right ovary is within normal limits measuring 3.3 x 1.8 x 1.8 cm.  The left ovary measures 3.3 x 1.4 x 2.5 cm.  There is an adjacent complex, multi  locular mass (image 34) between 2 and 4 cm in diameter with associated vascularity (image 38).  IMPRESSION: Left adnexal mass, absent IUP, and complicated free fluid in the pelvis - this constellation of findings is  most compatible with ruptured ectopic pregnancy, favored to be at the left adnexa.  Critical Value/emergent results were called by telephone at the time of interpretation on 02/17/2011  at 2122 hours  to  provider Henrietta Hoover, who verbally acknowledged these results.  Original Report Authenticated By: Harley Hallmark, M.D.   US Ob Transvaginal  02/18/2012  *RADIOLOGY REPORT*  Clinical Data: 21 year old female with positive pregnancy test and several weeks of bleeding.  Recurrent bleeding and some pain today. Quantitative beta HCG pending.  OBSTETRIC <14 WK [place] AND TRANSVAGINAL OB US  Technique:  Both transabdominal and transvaginal ultrasound examinations were performed for complete evaluation of the gestation as well as the maternal uterus, adnexal regions, and pelvic cul-de-sac.  Transvaginal technique was performed to assess early pregnancy.  Comparison:  None relevant.  Intrauterine gestational sac:  None. Yolk sac: None. Embryo: None. Cardiac Activity: None.  Maternal uterus/adnexae: A small to moderate volume of fluid with a echoes in the pelvis. Myometrium and endometrium appear homogeneous.  The right ovary is within normal limits measuring 3.3 x 1.8 x 1.8 cm.  The left ovary measures 3.3 x 1.4 x 2.5 cm.  There is an adjacent complex, multi locular mass (image 34) between 2 and 4 cm in diameter with associated vascularity (image 38).  IMPRESSION: Left adnexal mass, absent IUP, and complicated free fluid in the pelvis - this constellation of findings is most compatible with ruptured ectopic pregnancy, favored to be at the left adnexa.  Critical Value/emergent results were called by telephone at the time of interpretation on 02/17/2011  at 2122 hours  to  provider Henrietta Hoover, who verbally  acknowledged these results.  Original Report Authenticated By: Harley Hallmark, M.D.   Dr. Jolayne Panther notified and here to take over care of patient.  Assessment and Plan  Ruptured ectopic preg: admit for surgery.  Clinton Gallant. Shavonte Zhao III, DrHSc, MPAS, PA-C  02/18/2012, 8:18 PM   Henrietta Hoover, PA 02/18/12 2142

## 2012-02-18 NOTE — Preoperative (Signed)
Beta Blockers   Reason not to administer Beta Blockers:Not Applicable 

## 2012-02-18 NOTE — ED Notes (Signed)
Dr Jolayne Panther in to discuss surgery for ectopic pregnancy.

## 2012-02-18 NOTE — Discharge Instructions (Signed)
Diagnostic Laparoscopy Laparoscopy is a surgical procedure. It is used to diagnose and treat diseases inside the belly(abdomen). It is usually a brief, common, and relatively simple procedure. The laparoscopeis a thin, lighted, pencil-sized instrument. It is like a telescope. It is inserted into your abdomen through a small cut (incision). Your caregiver can look at the organs inside your body through this instrument. He or she can see if there is anything abnormal. Laparoscopy can be done either in a hospital or outpatient clinic. You may be given a mild sedative to help you relax before the procedure. Once in the operating room, you will be given a drug to make you sleep (general anesthesia). Laparoscopy usually lasts less than 1 hour. After the procedure, you will be monitored in a recovery area until you are stable and doing well. Once you are home, it will take 2 to 3 days to fully recover. RISKS AND COMPLICATIONS  Laparoscopy has relatively few risks. Your caregiver will discuss the risks with you before the procedure. Some problems that can occur include:  Infection.   Bleeding.   Damage to other organs.   Anesthetic side effects.  PROCEDURE Once you receive anesthesia, your surgeon inflates the abdomen with a harmless gas (carbon dioxide). This makes the organs easier to see. The laparoscope is inserted into the abdomen through a small incision. This allows your surgeon to see into the abdomen. Other small instruments are also inserted into the abdomen through other small openings. Many surgeons attach a video camera to the laparoscope to enlarge the view. During a diagnostic laparoscopy, the surgeon may be looking for inflammation, infection, or cancer. Your surgeon may take tissue samples(biopsies). The samples are sent to a specialist in looking at cells and tissue samples (pathologist). The pathologist examines them under a microscope. Biopsies can help to diagnose or confirm a  disease. AFTER THE PROCEDURE   The gas is released from inside the abdomen.   The incisions are closed with stitches (sutures). Because these incisions are small (usually less than 1/2 inch), there is usually minimal discomfort after the procedure. There may be some mild discomfort in the throat. This is from the tube placed in the throat while you were sleeping. You may have some mild abdominal discomfort. There may also be discomfort from the instrument placement incisions in the abdomen.   The recovery time is shortened as long as there are no complications.   You will rest in a recovery room until stable and doing well. As long as there are no complications, you may be allowed to go home.  FINDING OUT THE RESULTS OF YOUR TEST Not all test results are available during your visit. If your test results are not back during the visit, make an appointment with your caregiver to find out the results. Do not assume everything is normal if you have not heard from your caregiver or the medical facility. It is important for you to follow up on all of your test results. HOME CARE INSTRUCTIONS   Take all medicines as directed.   Only take over-the-counter or prescription medicines for pain, discomfort, or fever as directed by your caregiver.   Resume daily activities as directed.   Showers are preferred over baths.   You may resume sexual activities in 2 week or as directed.   Do not drive while taking narcotics.   Remove dressings after 24 hr. Small strips of tape will fall off on their own. SEEK MEDICAL CARE IF:   There  is increasing abdominal pain.   There is new pain in the shoulders (shoulder strap areas).   You feel lightheaded or faint.   You have the chills.   You or your child has an oral temperature above 100.4 F (38 C).   There is pus-like (purulent) drainage from any of the wounds.   You are unable to pass gas or have a bowel movement.   You feel sick to your stomach  (nauseous) or throw up (vomit).  MAKE SURE YOU:   Understand these instructions.   Will watch your condition.   Will get help right away if you are not doing well or get worse.  Document Released: 03/20/2001 Document Revised: 08/24/2011 Document Reviewed: 12/12/2007 [facility] Patient Information 2012 [place], [place].

## 2012-02-18 NOTE — H&P (Signed)
  21 yo G3P1011 presenting today for evaluation of vaginal bleeding. Patient reports experiencing vaginal bleeding x 3 weeks one month ago and was told she was having a miscarriage. Patient failed to follow-up with PCP due to lack of insurance. Patient experienced persistent bleeding today and abdominal pain and came in to be evaluated.  History reviewed. No pertinent past medical history. Past Surgical History  Procedure Date  . Cesarean section    Family History  Problem Relation Age of Onset  . Anesthesia problems Neg Hx   . Hypotension Neg Hx   . Malignant hyperthermia Neg Hx   . Pseudochol deficiency Neg Hx    History  Substance Use Topics  . Smoking status: Never Smoker   . Smokeless tobacco: Not on file  . Alcohol Use: No   GENERAL: Well-developed, well-nourished female in no acute distress.  HEENT: Normocephalic, atraumatic. Sclerae anicteric.  NECK: Supple. Normal thyroid.  LUNGS: Clear to auscultation bilaterally.  HEART: Regular rate and rhythm. ABDOMEN: Soft, nontender, nondistended. No organomegaly. Mild tenderness in lower abdomen PELVIC: deferred EXTREMITIES: No cyanosis, clubbing, or edema, 2+ distal pulses.  Ultrasound: No IUP, Left adnexal structure and complex free fluid suggestive of ruptured ectopic pregnancy  A/P 21 yo G3P101 with ruptured ectopic pregnancy - Diagnosis explained to the patient - Patient counseled on need for surgical treatment. Risk, benefits and alternatives explained including but not limited to risk of bleeding, infection and damage to adjacent organs. Patient verbalized understanding. Patient consented for laparoscopic salpingectomy.

## 2012-02-18 NOTE — Transfer of Care (Signed)
Immediate Anesthesia Transfer of Care Note  Patient: Kathy Hill  Procedure(s) Performed: Procedure(s) (LRB): LAPAROSCOPY OPERATIVE (N/A)  Patient Location: PACU  Anesthesia Type: General  Level of Consciousness: awake  Airway & Oxygen Therapy: Patient Spontanous Breathing and Patient connected to nasal cannula oxygen  Post-op Assessment: Report given to PACU RN and Post -op Vital signs reviewed and stable  Post vital signs: Reviewed  Complications: No apparent anesthesia complications

## 2012-02-18 NOTE — Anesthesia Preprocedure Evaluation (Addendum)
Anesthesia Evaluation  Patient identified by MRN, date of birth, ID band Patient awake    Reviewed: Allergy & Precautions, H&P , NPO status , Patient's Chart, lab work & pertinent test results  Airway Mallampati: I TM Distance: >3 FB Neck ROM: full    Dental No notable dental hx. (+) Teeth Intact   Pulmonary neg pulmonary ROS,    Pulmonary exam normal       Cardiovascular neg cardio ROS     Neuro/Psych Negative Neurological ROS  Negative Psych ROS   GI/Hepatic negative GI ROS, Neg liver ROS,   Endo/Other  Morbid obesity  Renal/GU negative Renal ROS  Genitourinary negative   Musculoskeletal negative musculoskeletal ROS (+)   Abdominal Normal abdominal exam  (+) obese,   Peds negative pediatric ROS (+)  Hematology negative hematology ROS (+)   Anesthesia Other Findings   Reproductive/Obstetrics negative OB ROS                           Anesthesia Physical Anesthesia Plan  ASA: III and Emergent  Anesthesia Plan: General   Post-op Pain Management:    Induction: Intravenous  Airway Management Planned: Oral ETT  Additional Equipment:   Intra-op Plan:   Post-operative Plan: Extubation in OR  Informed Consent: I have reviewed the patients History and Physical, chart, labs and discussed the procedure including the risks, benefits and alternatives for the proposed anesthesia with the patient or authorized representative who has indicated his/her understanding and acceptance.   Dental Advisory Given  Plan Discussed with: CRNA and Surgeon  Anesthesia Plan Comments:        Anesthesia Quick Evaluation

## 2012-02-18 NOTE — ED Notes (Signed)
Henrietta Hoover PA in to discuss u/s results and plan of care with pt.

## 2012-02-18 NOTE — ED Notes (Signed)
Dr Jolayne Panther in earlier, permit signed, pt's questions answered.

## 2012-02-19 LAB — URINE CULTURE: Colony Count: 4000

## 2012-02-19 MED ORDER — PROMETHAZINE HCL 25 MG/ML IJ SOLN: 6.2500 mg | INTRAMUSCULAR | Status: DC | PRN

## 2012-02-19 MED ORDER — KETOROLAC TROMETHAMINE 30 MG/ML IJ SOLN: 15.0000 mg | Freq: Once | INTRAMUSCULAR | Status: DC | PRN

## 2012-02-19 MED ORDER — HYDROMORPHONE HCL PF 1 MG/ML IJ SOLN
0.2500 mg | INTRAMUSCULAR | Status: DC | PRN
  Administered 2012-02-19 (×3): 0.5 mg via INTRAVENOUS

## 2012-02-19 MED ORDER — HYDROMORPHONE HCL PF 1 MG/ML IJ SOLN
INTRAMUSCULAR | Status: AC
  Filled 2012-02-19: qty 1

## 2012-02-19 MED ORDER — MEPERIDINE HCL 25 MG/ML IJ SOLN: 6.2500 mg | INTRAMUSCULAR | Status: DC | PRN

## 2012-02-19 NOTE — Anesthesia Postprocedure Evaluation (Signed)
Anesthesia Post Note  Patient: Kathy Hill  Procedure(s) Performed: Procedure(s) (LRB): LAPAROSCOPY OPERATIVE (N/A)  Anesthesia type: General  Patient location: PACU  Post pain: Pain level controlled  Post assessment: Post-op Vital signs reviewed  Last Vitals:  Filed Vitals:   02/19/12 0030  BP: 117/65  Pulse: 103  Temp:   Resp: 23    Post vital signs: Reviewed  Level of consciousness: sedated  Complications: No apparent anesthesia complicationsfj

## 2012-02-20 ENCOUNTER — Encounter (HOSPITAL_COMMUNITY): Payer: Self-pay | Admitting: Obstetrics and Gynecology

## 2012-02-20 LAB — GC/CHLAMYDIA PROBE AMP, GENITAL: Chlamydia, DNA Probe: NEGATIVE

## 2012-02-20 NOTE — ED Provider Notes (Signed)
Agree with above note.  Kathy Hill 02/20/2012 8:51 AM

## 2012-03-08 ENCOUNTER — Ambulatory Visit (INDEPENDENT_AMBULATORY_CARE_PROVIDER_SITE_OTHER): Payer: Self-pay | Admitting: Obstetrics and Gynecology

## 2012-03-08 ENCOUNTER — Encounter: Payer: Self-pay | Admitting: Obstetrics and Gynecology

## 2012-03-08 VITALS — BP 129/86 | HR 103 | Temp 97.7°F | Ht 66.0 in | Wt 210.4 lb

## 2012-03-08 DIAGNOSIS — Z8742 Personal history of other diseases of the female genital tract: Secondary | ICD-10-CM

## 2012-03-08 DIAGNOSIS — Z8759 Personal history of other complications of pregnancy, childbirth and the puerperium: Secondary | ICD-10-CM | POA: Insufficient documentation

## 2012-03-08 DIAGNOSIS — Z09 Encounter for follow-up examination after completed treatment for conditions other than malignant neoplasm: Secondary | ICD-10-CM

## 2012-03-08 NOTE — Progress Notes (Signed)
  Subjective:    Patient ID: Kathy Hill, female    DOB: [birth year], 21 y.o.   MRN: [id number]  HPI 21 yo G2P1011 presenting today for post-op check. Patient underwent on 02/18/2012 a LSC left salpingectomy with lysis of adhesion for the treatment of a left ruptured tubal ectopic pregnancy. Result of the pathology reviewed with the patient along with intra-operative findings of extensive adhesion suggestive of h/o PID. Patient reports doing well since the procedure and denies fever, chills, abdominal pain or drainage from her incisions.   History reviewed. No pertinent past medical history.  Past Surgical History  Procedure Date  . Cesarean section   . Laparoscopy 02/18/2012    Procedure: LAPAROSCOPY OPERATIVE;  Surgeon: Catalina Antigua, MD;  Location: [facility];  Service: Gynecology;  Laterality: N/A;  Lysis of Adhesions, Operative laparoscopy for ectopic pregnancy, left salpingectomy,    Family History  Problem Relation Age of Onset  . Anesthesia problems Neg Hx   . Hypotension Neg Hx   . Malignant hyperthermia Neg Hx   . Pseudochol deficiency Neg Hx    History  Substance Use Topics  . Smoking status: Never Smoker   . Smokeless tobacco: Not on file  . Alcohol Use: No    Review of Systems  All other systems reviewed and are negative.      Objective:   Physical Exam GENERAL: Well-developed, well-nourished female in no acute distress.  ABDOMEN: Soft, nontender, nondistended. No organomegaly.   Incisions- healed x 3. No erythema, induration or drainage PELVIC: Normal external female genitalia. Vagina is pink and rugated.  Normal discharge. Normal appearing cervix. Uterus is normal in size. No adnexal mass or tenderness. EXTREMITIES: No cyanosis, clubbing, or edema, 2+ distal pulses.     Assessment & Plan:  21 yo G2P1011 s/p lsc salpingectomy on 2/23 for treatment of ectopic pregnancy - patient medically cleared to resume all activities of daily living - this was not a  planned pregnancy and patient desires birth control but is uncertain of which type. Patient hesitant between Nexplanon and NuvaRing. Patient has had Mirena IUD in the past which needed to be removed due to malposition, she has tried depo-provera and does not which to be on it again, patient was not good in remembering to take OCP. - Patient is planning to follow-up with PCP for birth control initiation and is also welcomed to follow with [place].

## 2012-03-08 NOTE — Patient Instructions (Signed)
Contraception Choices Birth control (contraception) can stop pregnancy from happening. Different types of birth control work in different ways. Some can:  Make the mucus in the cervix thick. This makes it hard for sperm to get into the uterus.   Thin the lining of the uterus. This makes it hard for an egg to attach to the wall of the uterus.   Stop the ovaries from releasing an egg.   Block the sperm from reaching the egg.  Certain types of surgery can stop pregnancy from happening. For women, the sugery closes the fallopian tubes (tubal ligation). For men, the surgery stops sperm from releasing during sex (vasectomy). HORMONAL BIRTH CONTROL Hormonal birth control stops pregnancy by putting hormones into your body. Types of birth control include:  A small tube put under the skin of the upper arm (implant). The tube can stay in place for 3 years.   Shots given every 3 months.   Pills taken every day or once after sex (intercourse).   Patches that are changed once a week.   A ring put into the vagina (vaginal ring). The ring is left in place for 3 weeks and removed for 1 week. Then, a new ring is put in the vagina.  BARRIER BIRTH CONTROL  Barrier birth control blocks sperm from reaching the egg. Types of birth control include:   A thin covering worn on the penis (female condom) during sex.   A soft, loose covering put into the vagina (female condom) before sex.   A rubber bowl that sits over the cervix (diaphragm). The bowl must be made for you. The bowl is put into the vagina before sex. The bowl is left in place for 6 to 8 hours after sex.   A small, soft cup that fits over the cervix (cervical cap). The cup must be made for you. The cup can be left in place for 48 hours after sex.   A sponge that is put into the vagina before sex.   A chemical that kills or blocks sperm from getting into the cervix and uterus (spermicide). The chemical may be a cream, jelly, foam, or pill.    INTRAUTERINE (IUD) BIRTH CONTROL  IUD birth control is a small, T-shaped piece of plastic. The plastic is put inside the uterus. There are 2 types of IUD:  Copper IUD. The IUD is covered in copper wire. The copper makes a fluid that kills sperm. It can stay in place for 10 years.   Hormone IUD. The hormone stops pregnancy from happening. It can stay in place for 5 years.  NATURAL FAMILY PLANNING BIRTH CONTROL  Natural family planning means not having sex or using barrier birth control when the woman is fertile. A woman can:  Use a calendar to keep track of when she is fertile.   Use a thermometer to measure her body temperature.  Protect yourself against sexual diseases no matter what type of birth control you use. Talk to your doctor about which type of birth control is best for you. Document Released: 10/09/2009 Document Revised: 12/01/2011 Document Reviewed: 04/20/2011 [employer] Patient Information 2012 [employer]. 

## 2012-11-07 ENCOUNTER — Encounter (HOSPITAL_COMMUNITY): Payer: Self-pay | Admitting: *Deleted

## 2012-11-07 ENCOUNTER — Inpatient Hospital Stay (HOSPITAL_COMMUNITY)
Admission: AD | Admit: 2012-11-07 | Discharge: 2012-11-07 | Disposition: A | Discharge status: Home / Self Care | Payer: Medicaid Other | Source: Ambulatory Visit | Attending: Obstetrics & Gynecology | Admitting: Obstetrics & Gynecology

## 2012-11-07 DIAGNOSIS — N926 Irregular menstruation, unspecified: Secondary | ICD-10-CM | POA: Insufficient documentation

## 2012-11-07 DIAGNOSIS — N390 Urinary tract infection, site not specified: Secondary | ICD-10-CM

## 2012-11-07 LAB — CBC
HCT: 37.2 % (ref 36.0–46.0)
Hemoglobin: 13 g/dL (ref 12.0–15.0)
MCH: 31.4 pg (ref 26.0–34.0)
MCHC: 34.9 g/dL (ref 30.0–36.0)

## 2012-11-07 LAB — URINALYSIS, ROUTINE W REFLEX MICROSCOPIC
Bilirubin Urine: NEGATIVE
Ketones, ur: 15 mg/dL — AB
Nitrite: POSITIVE — AB
pH: 7.5 (ref 5.0–8.0)

## 2012-11-07 MED ORDER — CIPROFLOXACIN HCL 500 MG PO TABS: 500.0000 mg | ORAL_TABLET | Freq: Two times a day (BID) | ORAL | Status: DC

## 2012-11-07 NOTE — MAU Note (Signed)
Pt states she started having vaginal bleeding yesterday that started off as spotting and became more constant today

## 2012-11-07 NOTE — MAU Provider Note (Signed)
History     CSN: [id number]  Arrival date and time: 11/07/12 2149   None     Chief Complaint  Patient presents with  . Vaginal Bleeding  . Possible Pregnancy   HPI  Kathy Hill is a 21 y.o. G2P1011 who has been bleeding since Sunday. It started as a light brown spotting. No bleeding Monday, and Tuesday it was light. Today it has been very heavy she has been changing a super plus tampon every 30 mins. She said she had a + UPT, but states that the test sat out for "a long time" before she looked at the results, at which time it had a faint line.   History reviewed. No pertinent past medical history.  Past Surgical History  Procedure Date  . Cesarean section   . Laparoscopy 02/18/2012    Procedure: LAPAROSCOPY OPERATIVE;  Surgeon: Catalina Antigua, MD;  Location: [facility];  Service: Gynecology;  Laterality: N/A;  Lysis of Adhesions, Operative laparoscopy for ectopic pregnancy, left salpingectomy,     Family History  Problem Relation Age of Onset  . Anesthesia problems Neg Hx   . Hypotension Neg Hx   . Malignant hyperthermia Neg Hx   . Pseudochol deficiency Neg Hx   . Thyroid disease Mother   . Kidney disease Father   . Hypertension Father     History  Substance Use Topics  . Smoking status: Never Smoker   . Smokeless tobacco: Not on file  . Alcohol Use: No    Allergies: No Known Allergies  Prescriptions prior to admission  Medication Sig Dispense Refill  . BIOTIN PO Take 1 tablet by mouth daily. Pt does not know strength      . docusate sodium (COLACE) 100 MG capsule Take 200 mg by mouth 2 (two) times daily as needed. constipation      . Multiple Vitamin (MULITIVITAMIN WITH MINERALS) TABS Take 1 tablet by mouth daily.      . Multiple Vitamins-Minerals (ALIVE WOMENS ENERGY PO) Take 1 tablet by mouth daily.        Review of Systems  Constitutional: Negative for fever and chills.  Gastrointestinal: Positive for nausea and abdominal pain. Negative for vomiting,  diarrhea and constipation.  Genitourinary: Negative for dysuria, urgency and frequency.   Physical Exam   Blood pressure 128/90, pulse 113, temperature 99 F (37.2 C), temperature source Oral, resp. rate 16, height 5\' 6"  (1.676 m), weight 102.604 kg (226 lb 3.2 oz), last menstrual period 10/12/2012.  Physical Exam  Nursing note and vitals reviewed. Constitutional: She is oriented to person, place, and time. She appears well-developed and well-nourished.  Cardiovascular: Normal rate and regular rhythm.   Respiratory: Effort normal and breath sounds normal.  GI: Soft. She exhibits no distension. There is no tenderness.  Genitourinary:        External: normal Vagina: normal, moderate amount of BRB in vault with small dime sized clots Cervix: normal, no CMT Uterus: AV, NSSC Adnexa: NT  Neurological: She is alert and oriented to person, place, and time.  Skin: Skin is warm and dry.  Psychiatric: She has a normal mood and affect.    MAU Course  Procedures  Results for orders placed during the hospital encounter of 11/07/12 (from the past 24 hour(s))  URINALYSIS, ROUTINE W REFLEX MICROSCOPIC     Status: Abnormal   Collection Time   11/07/12 10:08 PM      Component Value Range   Color, Urine RED (*) YELLOW   APPearance  CLEAR  CLEAR   Specific Gravity, Urine 1.020  1.005 - 1.030   pH 7.5  5.0 - 8.0   Glucose, UA NEGATIVE  NEGATIVE mg/dL   Hgb urine dipstick LARGE (*) NEGATIVE   Bilirubin Urine NEGATIVE  NEGATIVE   Ketones, ur 15 (*) NEGATIVE mg/dL   Protein, ur 130 (*) NEGATIVE mg/dL   Urobilinogen, UA 1.0  0.0 - 1.0 mg/dL   Nitrite POSITIVE (*) NEGATIVE   Leukocytes, UA TRACE (*) NEGATIVE  URINE MICROSCOPIC-ADD ON     Status: Abnormal   Collection Time   11/07/12 10:08 PM      Component Value Range   Squamous Epithelial / LPF RARE  RARE   WBC, UA 3-6  <3 WBC/hpf   RBC / HPF TOO NUMEROUS TO COUNT  <3 RBC/hpf   Bacteria, UA MANY (*) RARE  CBC     Status: Abnormal    Collection Time   11/07/12 10:20 PM      Component Value Range   WBC 16.4 (*) 4.0 - 10.5 K/uL   RBC 4.14  3.87 - 5.11 MIL/uL   Hemoglobin 13.0  12.0 - 15.0 g/dL   HCT 86.5  78.4 - 69.6 %   MCV 89.9  78.0 - 100.0 fL   MCH 31.4  26.0 - 34.0 pg   MCHC 34.9  30.0 - 36.0 g/dL   RDW 29.5  28.4 - 13.2 %   Platelets 341  150 - 400 K/uL    Assessment and Plan   1. UTI (urinary tract infection)   2. Abnormal menstrual cycle   Keep menstrual log FU with PCP if sx worsen Cipro 500mg  PO BID X 5 days  Tawnya Crook 11/07/2012, 10:36 PM

## 2012-11-07 NOTE — MAU Note (Signed)
Pt LMP 10/12/2012, +UPT at home, having bleeding x 2 days, heavy today.  Mild cramping.

## 2012-11-07 NOTE — MAU Note (Signed)
Pt states she took a pregnancy test at home the second line was faint but it was there

## 2012-11-09 LAB — URINE CULTURE
Colony Count: NO GROWTH
Culture: NO GROWTH

## 2012-12-09 ENCOUNTER — Inpatient Hospital Stay (HOSPITAL_COMMUNITY)
Admission: AD | Admit: 2012-12-09 | Discharge: 2012-12-09 | Disposition: A | Discharge status: Home / Self Care | Payer: Medicaid Other | Source: Ambulatory Visit | Attending: Obstetrics & Gynecology | Admitting: Obstetrics & Gynecology

## 2012-12-09 ENCOUNTER — Inpatient Hospital Stay (HOSPITAL_COMMUNITY): Payer: Medicaid Other

## 2012-12-09 ENCOUNTER — Encounter (HOSPITAL_COMMUNITY): Payer: Self-pay | Admitting: *Deleted

## 2012-12-09 DIAGNOSIS — R109 Unspecified abdominal pain: Secondary | ICD-10-CM

## 2012-12-09 DIAGNOSIS — N83201 Unspecified ovarian cyst, right side: Secondary | ICD-10-CM

## 2012-12-09 DIAGNOSIS — N83209 Unspecified ovarian cyst, unspecified side: Secondary | ICD-10-CM | POA: Insufficient documentation

## 2012-12-09 HISTORY — DX: Unspecified ectopic pregnancy without intrauterine pregnancy: O00.90

## 2012-12-09 HISTORY — DX: Unspecified infectious disease: B99.9

## 2012-12-09 HISTORY — DX: Urinary tract infection, site not specified: N39.0

## 2012-12-09 HISTORY — DX: Herpesviral infection of urogenital system, unspecified: A60.00

## 2012-12-09 HISTORY — DX: Dermatitis, unspecified: L30.9

## 2012-12-09 HISTORY — DX: Unspecified ovarian cyst, unspecified side: N83.209

## 2012-12-09 LAB — URINALYSIS, ROUTINE W REFLEX MICROSCOPIC
Glucose, UA: NEGATIVE mg/dL
Ketones, ur: NEGATIVE mg/dL
Protein, ur: NEGATIVE mg/dL

## 2012-12-09 NOTE — MAU Provider Note (Signed)
CC: Abdominal Pain   HPI Kathy Hill is a 21 y.o. [id number] who presents with 1 week history of right suprapubic sharp pains noted mostly with urination and with intercourse. Denies burning with urination, urinary urgency or frequency, or gross hematuria. Seen here one month ago for an abnormal menstrual period which was heavy and 1 week early occurring 11/06/2012. No menses since and having unprotected intercourse. Had trouble with Mirena but wants to get back on birth control. Denies vaginal irritation or itching. No new sexual partner. Had negative GC and Chlamydia last month. Was treated for suspected UTI here last month but urine culture was negative.    Past Medical History  Diagnosis Date  . Ectopic pregnancy   . Urinary tract infection   . Eczema   . Ovarian cyst   . Genital herpes   . Infection     chlamydia    OB History    Grav Para Term Preterm Abortions TAB SAB Ect Mult Living   3 1 1  0 2 1 0 1 0 1     # Outc Date GA Lbr Len/2nd Wgt Sex Del Anes PTL Lv   1 TRM 1/11    F LTCS EPI No Yes   Comments: fetal heart dropping with ctx's   2 TAB            3 ECT            Comments: ruptured ectopic, right      Past Surgical History  Procedure Date  . Cesarean section   . Laparoscopy 02/18/2012    Procedure: LAPAROSCOPY OPERATIVE;  Surgeon: Catalina Antigua, MD;  Location: [facility];  Service: Gynecology;  Laterality: N/A;  Lysis of Adhesions, Operative laparoscopy for ectopic pregnancy, left salpingectomy,   . Therapeutic abortion     History   Social History  . Marital Status: Single    Spouse Name: N/A    Number of Children: N/A  . Years of Education: N/A   Occupational History  . Not on file.   Social History Main Topics  . Smoking status: Never Smoker   . Smokeless tobacco: Never Used  . Alcohol Use: Yes     Comment: rare  . Drug Use: No  . Sexually Active: Yes    Birth Control/ Protection: None   Other Topics Concern  . Not on file   Social  History Narrative  . No narrative on file    No current facility-administered medications on file prior to encounter.   No current outpatient prescriptions on file prior to encounter.    No Known Allergies  ROS Pertinent items in HPI  PHYSICAL EXAM Filed Vitals:   12/09/12 1427  BP: 126/72  Pulse: 91  Temp: 98.3 F (36.8 C)  Resp: 18   General: Well nourished, well developed female in no acute distress Cardiovascular: Normal rate Respiratory: Normal effort Abdomen: Soft, nontender, no guarding Back: No CVAT Extremities: No edema Neurologic: Alert and oriented Bimanual exam: cervix closed, minimal CMT, uterus mobile, nontender; very tender in right adnexa no mass felt; no left adnexal tenderness  LAB RESULTS Results for orders placed during the hospital encounter of 12/09/12 (from the past 24 hour(s))  URINALYSIS, ROUTINE W REFLEX MICROSCOPIC     Status: Abnormal   Collection Time   12/09/12  2:15 PM      Component Value Range   Color, Urine YELLOW  YELLOW   APPearance CLEAR  CLEAR   Specific Gravity, Urine 1.025  1.005 - 1.030   pH 6.0  5.0 - 8.0   Glucose, UA NEGATIVE  NEGATIVE mg/dL   Hgb urine dipstick TRACE (*) NEGATIVE   Bilirubin Urine NEGATIVE  NEGATIVE   Ketones, ur NEGATIVE  NEGATIVE mg/dL   Protein, ur NEGATIVE  NEGATIVE mg/dL   Urobilinogen, UA 0.2  0.0 - 1.0 mg/dL   Nitrite NEGATIVE  NEGATIVE   Leukocytes, UA NEGATIVE  NEGATIVE  URINE MICROSCOPIC-ADD ON     Status: Normal   Collection Time   12/09/12  2:15 PM      Component Value Range   Squamous Epithelial / LPF RARE  RARE   WBC, UA 0-2  <3 WBC/hpf  POCT PREGNANCY, URINE     Status: Normal   Collection Time   12/09/12  2:18 PM      Component Value Range   Preg Test, Ur NEGATIVE  NEGATIVE    IMAGING  US Transvaginal Non-ob  12/09/2012  *RADIOLOGY REPORT*  Clinical Data: Right-sided pelvic pain.  Negative pregnancy test. LMP 11/06/2012.  Previous right salpingectomy.  TRANSABDOMINAL AND  TRANSVAGINAL ULTRASOUND OF PELVIS  Technique:  Both transabdominal and transvaginal ultrasound examinations of the pelvis were performed.  Transabdominal technique was performed for global imaging of the pelvis including uterus, ovaries, adnexal regions, and pelvic cul-de-sac.  It was necessary to proceed with endovaginal exam following the transabdominal exam to visualize the .  Comparison:  09/22/2010  Findings: Uterus:  10.0 x 5.1 x 5.9 cm.  No fibroids or other uterine mass identified.  Prior C-section scar incidentally noted.  Endometrium: Double layer thickness measures 9 mm transvaginally. No focal lesion visualized.  Right ovary: 4.9 x 2.9 x 2.9 cm.  A small complex cyst is seen containing multiple thin septations, which measures 1.5 cm.  No evidence of blood flow within this lesion on color Doppler ultrasound.  Left ovary: 4.7 x 2.5 x 2.7 cm.  Normal appearance.  Other Findings:  No free fluid  IMPRESSION:  1.  1.5 cm complex right ovariancyst, with indeterminate but probably benign characteristics.  Recommend follow-up by transvaginal ultrasound in 6-12 weeks. This recommendation follows the consensus statement:  Management of Asymptomatic Ovarian and Other Adnexal Cysts Imaged at [place]:  Society of Radiologists in Ultrasound Consensus Conference Statement.  Radiology 2010; 256:943- 954. 2.  Normal appearance of uterus and left ovary.   Original Report Authenticated By: Myles Rosenthal, M.D.    US Pelvis Complete  12/09/2012  *RADIOLOGY REPORT*  Clinical Data: Right-sided pelvic pain.  Negative pregnancy test. LMP 11/06/2012.  Previous right salpingectomy.  TRANSABDOMINAL AND TRANSVAGINAL ULTRASOUND OF PELVIS  Technique:  Both transabdominal and transvaginal ultrasound examinations of the pelvis were performed.  Transabdominal technique was performed for global imaging of the pelvis including uterus, ovaries, adnexal regions, and pelvic cul-de-sac.  It was necessary to proceed with endovaginal exam following  the transabdominal exam to visualize the .  Comparison:  09/22/2010  Findings: Uterus:  10.0 x 5.1 x 5.9 cm.  No fibroids or other uterine mass identified.  Prior C-section scar incidentally noted.  Endometrium: Double layer thickness measures 9 mm transvaginally. No focal lesion visualized.  Right ovary: 4.9 x 2.9 x 2.9 cm.  A small complex cyst is seen containing multiple thin septations, which measures 1.5 cm.  No evidence of blood flow within this lesion on color Doppler ultrasound.  Left ovary: 4.7 x 2.5 x 2.7 cm.  Normal appearance.  Other Findings:  No free fluid  IMPRESSION:  1.  1.5 cm complex right ovariancyst, with indeterminate but probably benign characteristics.  Recommend follow-up by transvaginal ultrasound in 6-12 weeks. This recommendation follows the consensus statement:  Management of Asymptomatic Ovarian and Other Adnexal Cysts Imaged at [place]:  Society of Radiologists in Ultrasound Consensus Conference Statement.  Radiology 2010; 256:943- 954. 2.  Normal appearance of uterus and left ovary.   Original Report Authenticated By: Myles Rosenthal, M.D.    MAU COURSE Declines analgesic  ASSESSMENT  1. Abdominal pain   2. Ovarian cyst, right   Probably benign small complex ovarian cyst> F/U [facility] Needs contraception> F/U [facility]  PLAN Discharge home. See AVS for patient education.  Follow-up Information    Follow up with [facility]. (someone from [facility] will call you with an appointment)    Contact information:   [address] [place] [address] [phone]          Medication List     As of 12/09/2012  5:26 PM         Veena Sturgess Colin Mulders, CNM 12/09/2012 3:37 PM

## 2012-12-09 NOTE — MAU Provider Note (Signed)

## 2012-12-09 NOTE — MAU Note (Signed)
Last time she was here (11/07/12) had an abnormal cycle, was early, heavy with huge clots; and had UTI. Took meds.  Lower abd pain continues.  Has not had period since then, has had unprotected sex.

## 2012-12-10 ENCOUNTER — Telehealth: Payer: Self-pay | Admitting: General Practice

## 2012-12-10 NOTE — Telephone Encounter (Signed)
Called pt and left message on her personal voice mail that an appt has been made for her. Details stated in the message- she may call back if she has questions.

## 2012-12-10 NOTE — Telephone Encounter (Signed)
Message copied by Kathee Delton on Mon Dec 10, 2012  8:42 AM ------      Message from: Kathy Hill      Created: Sun Dec 09, 2012  5:22 PM       Please give appt for F/U ovarian cyst and birth control

## 2012-12-10 NOTE — Telephone Encounter (Signed)
Called patient but no answer, left message to call us at the clinics for appt info. Appt has been made for 12/26 @ 2pm with Dr Debroah Loop

## 2012-12-17 ENCOUNTER — Encounter (HOSPITAL_COMMUNITY): Payer: Self-pay | Admitting: Family

## 2012-12-17 ENCOUNTER — Inpatient Hospital Stay (HOSPITAL_COMMUNITY)
Admission: AD | Admit: 2012-12-17 | Discharge: 2012-12-17 | Disposition: A | Discharge status: Home / Self Care | Payer: Medicaid Other | Source: Ambulatory Visit | Attending: Obstetrics & Gynecology | Admitting: Obstetrics & Gynecology

## 2012-12-17 DIAGNOSIS — N760 Acute vaginitis: Secondary | ICD-10-CM

## 2012-12-17 DIAGNOSIS — B9689 Other specified bacterial agents as the cause of diseases classified elsewhere: Secondary | ICD-10-CM | POA: Insufficient documentation

## 2012-12-17 DIAGNOSIS — A499 Bacterial infection, unspecified: Secondary | ICD-10-CM | POA: Insufficient documentation

## 2012-12-17 DIAGNOSIS — R109 Unspecified abdominal pain: Secondary | ICD-10-CM | POA: Insufficient documentation

## 2012-12-17 HISTORY — DX: Trichomoniasis, unspecified: A59.9

## 2012-12-17 HISTORY — DX: Other specified bacterial agents as the cause of diseases classified elsewhere: B96.89

## 2012-12-17 HISTORY — DX: Gonococcal infection, unspecified: A54.9

## 2012-12-17 HISTORY — DX: Other specified bacterial agents as the cause of diseases classified elsewhere: N76.0

## 2012-12-17 LAB — URINALYSIS, ROUTINE W REFLEX MICROSCOPIC
Nitrite: NEGATIVE
Specific Gravity, Urine: 1.015 (ref 1.005–1.030)
Urobilinogen, UA: 0.2 mg/dL (ref 0.0–1.0)
pH: 6 (ref 5.0–8.0)

## 2012-12-17 LAB — URINE MICROSCOPIC-ADD ON

## 2012-12-17 LAB — WET PREP, GENITAL: Yeast Wet Prep HPF POC: NONE SEEN

## 2012-12-17 LAB — POCT PREGNANCY, URINE: Preg Test, Ur: NEGATIVE

## 2012-12-17 MED ORDER — METRONIDAZOLE 500 MG PO TABS: 500.0000 mg | ORAL_TABLET | Freq: Two times a day (BID) | ORAL | Status: DC

## 2012-12-17 MED ORDER — FLUCONAZOLE 100 MG PO TABS: 100.0000 mg | ORAL_TABLET | Freq: Every day | ORAL | Status: DC

## 2012-12-17 NOTE — MAU Provider Note (Signed)
History     CSN: [id number]  Arrival date and time: 12/17/12 1210   First Provider Initiated Contact with Patient 12/17/12 1314      Chief Complaint  Patient presents with  . Abdominal Pain   HPI Ms. Kathy Hill is a 21 year old G3 P1021 who presents to MAU today for evaluation of abdominal pain. The patient states that the pain has been present for 2-3 weeks. She was seen in MAU previously and diagnosed with ovarian cysts. She has an appointment for follow-up scheduled in the Clinic, but they had not been able to get in touch with her to let her know about that. The patient states that the pain waxes and wanes and is not present at all right now. At it's worst the pain can reach a 7-8/10. It is located in the lower abdomen. She does have a vaginal discharge that she states has been present for approximately 2 days. She feels that it has an odor. She has had BV in the past and feels this is similar. She states that she had some mild nausea recently, but denies vomiting or diarrhea. She does state that she has had some constipation recently.   The patient has a history of ruptured ectopic in February of this year. She was operated on by Dr. Jolayne Panther and followed up in the clinic post op.   OB History    Grav Para Term Preterm Abortions TAB SAB Ect Mult Living   3 1 1  0 2 1 0 1 0 1      Past Medical History  Diagnosis Date  . Ectopic pregnancy   . Urinary tract infection   . Eczema   . Ovarian cyst   . Genital herpes   . Infection     chlamydia  . Gonorrhea   . Trichimoniasis   . BV (bacterial vaginosis)     Past Surgical History  Procedure Date  . Cesarean section   . Laparoscopy 02/18/2012    Procedure: LAPAROSCOPY OPERATIVE;  Surgeon: Catalina Antigua, MD;  Location: [facility];  Service: Gynecology;  Laterality: N/A;  Lysis of Adhesions, Operative laparoscopy for ectopic pregnancy, left salpingectomy,   . Therapeutic abortion     Family History  Problem Relation Age  of Onset  . Anesthesia problems Neg Hx   . Hypotension Neg Hx   . Malignant hyperthermia Neg Hx   . Pseudochol deficiency Neg Hx   . Other Neg Hx   . Thyroid disease Mother   . Kidney disease Father   . Hypertension Father     History  Substance Use Topics  . Smoking status: Never Smoker   . Smokeless tobacco: Never Used  . Alcohol Use: Yes     Comment: rare    Allergies: No Known Allergies  No prescriptions prior to admission    ROS All negative unless otherwise noted in HPI  Physical Exam   Blood pressure 121/83, pulse 87, temperature 97.2 F (36.2 C), temperature source Oral, resp. rate 18, height 5\' 6"  (1.676 m), weight 228 lb (103.42 kg), last menstrual period 11/06/2012, not currently breastfeeding.  Physical Exam  Constitutional: She is oriented to person, place, and time. She appears well-developed and well-nourished. No distress.  HENT:  Head: Normocephalic and atraumatic.  Cardiovascular: Normal rate, regular rhythm and normal heart sounds.  Exam reveals no gallop and no friction rub.   No murmur heard. Respiratory: Effort normal and breath sounds normal. No respiratory distress. She has no wheezes. She  has no rales.  GI: Soft. Bowel sounds are normal. She exhibits no distension and no mass. There is no tenderness. There is no rebound and no guarding.  Genitourinary: Vagina normal and uterus normal. Uterus is not enlarged and not tender. Cervix exhibits discharge (very small amount of mucus-like discharge noted. Wet prep and GC/CT probe obtained). Cervix exhibits no motion tenderness and no friability. Right adnexum displays no mass and no tenderness. Left adnexum displays no mass and no tenderness.  Neurological: She is alert and oriented to person, place, and time.  Skin: Skin is warm. No erythema.  Psychiatric: She has a normal mood and affect.    MAU Course  Procedures None  Results for orders placed during the hospital encounter of 12/17/12 (from the  past 24 hour(s))  URINALYSIS, ROUTINE W REFLEX MICROSCOPIC     Status: Abnormal   Collection Time   12/17/12 12:35 PM      Component Value Range   Color, Urine YELLOW  YELLOW   APPearance HAZY (*) CLEAR   Specific Gravity, Urine 1.015  1.005 - 1.030   pH 6.0  5.0 - 8.0   Glucose, UA NEGATIVE  NEGATIVE mg/dL   Hgb urine dipstick SMALL (*) NEGATIVE   Bilirubin Urine NEGATIVE  NEGATIVE   Ketones, ur NEGATIVE  NEGATIVE mg/dL   Protein, ur NEGATIVE  NEGATIVE mg/dL   Urobilinogen, UA 0.2  0.0 - 1.0 mg/dL   Nitrite NEGATIVE  NEGATIVE   Leukocytes, UA SMALL (*) NEGATIVE  URINE MICROSCOPIC-ADD ON     Status: Abnormal   Collection Time   12/17/12 12:35 PM      Component Value Range   Squamous Epithelial / LPF MANY (*) RARE   WBC, UA 0-2  <3 WBC/hpf   RBC / HPF 0-2  <3 RBC/hpf   Bacteria, UA FEW (*) RARE  POCT PREGNANCY, URINE     Status: Normal   Collection Time   12/17/12  1:13 PM      Component Value Range   Preg Test, Ur NEGATIVE  NEGATIVE  WET PREP, GENITAL     Status: Abnormal   Collection Time   12/17/12  1:30 PM      Component Value Range   Yeast Wet Prep HPF POC NONE SEEN  NONE SEEN   Trich, Wet Prep NONE SEEN  NONE SEEN   Clue Cells Wet Prep HPF POC FEW (*) NONE SEEN   WBC, Wet Prep HPF POC FEW (*) NONE SEEN    Assessment and Plan  A: Bacterial Vaginosis Abdominal pain most likely secondary to previously diagnosed ovarian cysts  P: Discharge home Rx for Flagyl sent to pharmacy. Rx for diflucan also sent to pharmacy, patient often has yeast infection following treatment with Flagyl Patient scheduled to follow-up in GYN clinic on 12/20/12. Plans to keep appointment Ibuprofen recommended for pain until follow-up Return to MAU if your condition should change or worsen.    Freddi Starr, PA-C 12/17/2012, 1:45 PM

## 2012-12-17 NOTE — MAU Note (Addendum)
Patient presents with dull bilateral lower abdominal pain; foul odor vaginal discharge x 2 days. Negative home pregnancy test today. Reports she has been constipated x 1 week.  Is concerned because of hx of ectopic pregnancy earlier this year. LMP 11/06/12.  Was seen here on 12/15.

## 2012-12-20 ENCOUNTER — Ambulatory Visit: Payer: Medicaid Other | Admitting: Obstetrics & Gynecology

## 2013-01-09 ENCOUNTER — Other Ambulatory Visit (HOSPITAL_COMMUNITY)
Admission: RE | Admit: 2013-01-09 | Discharge: 2013-01-09 | Disposition: A | Discharge status: Home / Self Care | Payer: Medicaid Other | Source: Ambulatory Visit | Attending: Obstetrics & Gynecology | Admitting: Obstetrics & Gynecology

## 2013-01-09 ENCOUNTER — Ambulatory Visit (INDEPENDENT_AMBULATORY_CARE_PROVIDER_SITE_OTHER): Payer: BC Managed Care – PPO | Admitting: Obstetrics & Gynecology

## 2013-01-09 ENCOUNTER — Encounter: Payer: Self-pay | Admitting: Obstetrics & Gynecology

## 2013-01-09 VITALS — BP 122/84 | HR 88 | Temp 97.8°F | Resp 20 | Ht 66.0 in | Wt 223.0 lb

## 2013-01-09 DIAGNOSIS — Z113 Encounter for screening for infections with a predominantly sexual mode of transmission: Secondary | ICD-10-CM

## 2013-01-09 DIAGNOSIS — Z01419 Encounter for gynecological examination (general) (routine) without abnormal findings: Secondary | ICD-10-CM | POA: Insufficient documentation

## 2013-01-09 DIAGNOSIS — Z124 Encounter for screening for malignant neoplasm of cervix: Secondary | ICD-10-CM

## 2013-01-09 DIAGNOSIS — Z23 Encounter for immunization: Secondary | ICD-10-CM

## 2013-01-09 DIAGNOSIS — Z1151 Encounter for screening for human papillomavirus (HPV): Secondary | ICD-10-CM | POA: Insufficient documentation

## 2013-01-09 DIAGNOSIS — B009 Herpesviral infection, unspecified: Secondary | ICD-10-CM

## 2013-01-09 DIAGNOSIS — Z0000 Encounter for general adult medical examination without abnormal findings: Secondary | ICD-10-CM

## 2013-01-09 MED ORDER — NORGESTREL-ETHINYL ESTRADIOL 0.3-30 MG-MCG PO TABS: 1.0000 | ORAL_TABLET | Freq: Every day | ORAL | Status: DC

## 2013-01-09 MED ORDER — INFLUENZA VIRUS VACC SPLIT PF IM SUSP
0.5000 mL | INTRAMUSCULAR | Status: AC
  Administered 2013-01-09: 0.5 mL via INTRAMUSCULAR

## 2013-01-09 MED ORDER — VALACYCLOVIR HCL 500 MG PO TABS: 500.0000 mg | ORAL_TABLET | Freq: Every day | ORAL | Status: DC

## 2013-01-09 MED ORDER — HPV QUADRIVALENT VACCINE IM SUSP: 0.5000 mL | Freq: Once | INTRAMUSCULAR | Status: DC

## 2013-01-09 NOTE — Progress Notes (Signed)
Pt has been having irregular periods since 10/2012. She also wants to start birth control: Nexplanon vs OCP's.

## 2013-01-09 NOTE — Progress Notes (Signed)
Subjective:    Kathy Hill is a 22 y.o. female who presents for an annual exam. The patient has no complaints today. She would like to restart OCPs.The patient is sexually active. GYN screening history: last pap: was normal. The patient wears seatbelts: yes. The patient participates in regular exercise: no. Has the patient ever been transfused or tattooed?: no. The patient reports that there is not domestic violence in her life.   Menstrual History: OB History    Grav Para Term Preterm Abortions TAB SAB Ect Mult Living   3 1 1  0 2 1 0 1 0 1      Menarche age: 30 Patient's last menstrual period was 12/25/2012.    The following portions of the patient's history were reviewed and updated as appropriate: allergies, current medications, past family history, past medical history, past social history, past surgical history and problem list.  Review of Systems A comprehensive review of systems was negative.  Monogamous for 1 year, engaged. Works at [employer], denies dyspareunia. Needs Gardasil and flu vaccine. She had + GC 2012. She has unprotected IC.   Objective:    BP 122/84  Pulse 88  Temp 97.8 F (36.6 C) (Oral)  Resp 20  Ht 5\' 6"  (1.676 m)  Wt 223 lb (101.152 kg)  BMI 35.99 kg/m2  LMP 12/25/2012  General Appearance:    Alert, cooperative, no distress, appears stated age  Head:    Normocephalic, without obvious abnormality, atraumatic  Eyes:    PERRL, conjunctiva/corneas clear, EOM's intact, fundi    benign, both eyes  Ears:    Normal TM's and external ear canals, both ears  Nose:   Nares normal, septum midline, mucosa normal, no drainage    or sinus tenderness  Throat:   Lips, mucosa, and tongue normal; teeth and gums normal  Neck:   Supple, symmetrical, trachea midline, no adenopathy;    thyroid:  no enlargement/tenderness/nodules; no carotid   bruit or JVD  Back:     Symmetric, no curvature, ROM normal, no CVA tenderness  Lungs:     Clear to auscultation  bilaterally, respirations unlabored  Chest Wall:    No tenderness or deformity   Heart:    Regular rate and rhythm, S1 and S2 normal, no murmur, rub   or gallop  Breast Exam:    No tenderness, masses, or nipple abnormality  Abdomen:     Soft, non-tender, bowel sounds active all four quadrants,    no masses, no organomegaly  Genitalia:    Normal female without lesion, discharge or tenderness, cervix friable with ectropion noted, NSSA, NT, normal adnexa exam     Extremities:   Extremities normal, atraumatic, no cyanosis or edema  Pulses:   2+ and symmetric all extremities  Skin:   Skin color, texture, turgor normal, no rashes or lesions  Lymph nodes:   Cervical, supraclavicular, and axillary nodes normal  Neurologic:   CNII-XII intact, normal strength, sensation and reflexes    throughout  .    Assessment:    Healthy female exam.  Contraception   Plan:     Breast self exam technique reviewed and patient encouraged to perform self-exam monthly. Chlamydia specimen. GC specimen. Thin prep Pap smear. Gardasil and flu vaccines  Start Lo ovral with NMP. Back up method for a month.

## 2013-01-16 ENCOUNTER — Telehealth: Payer: Self-pay | Admitting: *Deleted

## 2013-01-16 NOTE — Telephone Encounter (Signed)
Patient left a message requesting results of her pap and co testing. I informed her of these results and she had no further questions.

## 2013-03-08 ENCOUNTER — Ambulatory Visit: Payer: Medicaid Other

## 2013-03-11 ENCOUNTER — Inpatient Hospital Stay (HOSPITAL_COMMUNITY): Payer: Medicaid Other

## 2013-03-11 ENCOUNTER — Encounter (HOSPITAL_COMMUNITY): Payer: Self-pay

## 2013-03-11 ENCOUNTER — Inpatient Hospital Stay (HOSPITAL_COMMUNITY)
Admission: AD | Admit: 2013-03-11 | Discharge: 2013-03-11 | Disposition: A | Discharge status: Home / Self Care | Payer: Medicaid Other | Source: Ambulatory Visit | Attending: Obstetrics & Gynecology | Admitting: Obstetrics & Gynecology

## 2013-03-11 DIAGNOSIS — O00109 Unspecified tubal pregnancy without intrauterine pregnancy: Secondary | ICD-10-CM | POA: Insufficient documentation

## 2013-03-11 DIAGNOSIS — R109 Unspecified abdominal pain: Secondary | ICD-10-CM | POA: Insufficient documentation

## 2013-03-11 DIAGNOSIS — O26899 Other specified pregnancy related conditions, unspecified trimester: Secondary | ICD-10-CM

## 2013-03-11 DIAGNOSIS — O99891 Other specified diseases and conditions complicating pregnancy: Secondary | ICD-10-CM | POA: Insufficient documentation

## 2013-03-11 LAB — URINALYSIS, ROUTINE W REFLEX MICROSCOPIC
Leukocytes, UA: NEGATIVE
Protein, ur: NEGATIVE mg/dL
Urobilinogen, UA: 0.2 mg/dL (ref 0.0–1.0)

## 2013-03-11 LAB — URINE MICROSCOPIC-ADD ON

## 2013-03-11 LAB — CBC
HCT: 41.5 % (ref 36.0–46.0)
MCV: 87.6 fL (ref 78.0–100.0)
RBC: 4.74 MIL/uL (ref 3.87–5.11)
WBC: 13.5 10*3/uL — ABNORMAL HIGH (ref 4.0–10.5)

## 2013-03-11 LAB — WET PREP, GENITAL: Yeast Wet Prep HPF POC: NONE SEEN

## 2013-03-11 NOTE — MAU Note (Signed)
Patient is in with c/o sudden intermittent lower abdominal sharp pains. She states that [facility] but due to her history of an ectopic pregnancy, she was advised to come to MAU. She denies vaginal bleeding. lmp 01/29/13

## 2013-03-11 NOTE — MAU Provider Note (Signed)
History     CSN: [id number]  Arrival date and time: 03/11/13 4098   First Provider Initiated Contact with Patient 03/11/13 [phone]      Chief Complaint  Patient presents with  . Abdominal Pain   HPI Pt is a 22 y/o G97P1021 female, LMP 01/29/13, with a history of ectopic pregnancy in Feb 2013, with subsequent L-sided salpingectomy (pt says it was R-sided, however, Dr. Deretha Emory op note says L-sided), who presents today with L-sided abdominal pain for 6 days duration following positive home pregnancy test. She reports that the pain is sharp, waxes and wanes (1-2x day, several minutes per episode), and began on her left side but has now centralized to her suprapubic region. This morning pain was a 9/10. She is not currently experiencing any pain. She started OCPs in February and reports taking them consistently, but thinks she may have started them too late. She takes no other medications and has not taken anything for pain today. She has not experienced any pain like this in previous pregnancies. With her previous ectopic she did not experience any pain, only vaginal bleeding. Today she denies any vaginal bleeding, discharge, odor, itching, or vaginal pain. She has one sexual partner that she has been with for a while. No concern for STDs.  Social Denies tobacco, alcohol, illicit drugs. Works at the [employer], sport and exercise at [employer]. Engaged to be married.    Past Medical History  Diagnosis Date  . Ectopic pregnancy   . Urinary tract infection   . Eczema   . Ovarian cyst   . Genital herpes   . Infection     chlamydia  . Gonorrhea   . Trichimoniasis   . BV (bacterial vaginosis)     Past Surgical History  Procedure Laterality Date  . Cesarean section    . Laparoscopy  02/18/2012    Procedure: LAPAROSCOPY OPERATIVE;  Surgeon: Catalina Antigua, MD;  Location: [facility];  Service: Gynecology;  Laterality: N/A;  Lysis of Adhesions, Operative laparoscopy for ectopic pregnancy, left salpingectomy,   .  Therapeutic abortion    . Unilateral salpingectomy      right    Family History  Problem Relation Age of Onset  . Anesthesia problems Neg Hx   . Hypotension Neg Hx   . Malignant hyperthermia Neg Hx   . Pseudochol deficiency Neg Hx   . Other Neg Hx   . Thyroid disease Mother   . Kidney disease Father   . Hypertension Father     History  Substance Use Topics  . Smoking status: Never Smoker   . Smokeless tobacco: Never Used  . Alcohol Use: Yes     Comment: rare    Allergies: No Known Allergies  Prescriptions prior to admission  Medication Sig Dispense Refill  . valACYclovir (VALTREX) 500 MG tablet Take 500 mg by mouth daily as needed (with active breakouts).      . [DISCONTINUED] valACYclovir (VALTREX) 500 MG tablet Take 1 tablet (500 mg total) by mouth daily.  31 tablet  12    Review of Systems  Constitutional: Negative for fever, chills and malaise/fatigue.  Respiratory: Negative for shortness of breath.   Cardiovascular: Negative for chest pain and palpitations.  Gastrointestinal: Positive for nausea and abdominal pain. Negative for vomiting, diarrhea and constipation.  Genitourinary: Negative for dysuria and hematuria.  Endo/Heme/Allergies:       No history of anemia.   Results for orders placed during the hospital encounter of 03/11/13 (from the past 24 hour(s))  URINALYSIS, ROUTINE W REFLEX MICROSCOPIC     Status: Abnormal   Collection Time    03/11/13  9:35 AM      Result Value Range   Color, Urine YELLOW  YELLOW   APPearance HAZY (*) CLEAR   Specific Gravity, Urine >1.030 (*) 1.005 - 1.030   pH 6.0  5.0 - 8.0   Glucose, UA NEGATIVE  NEGATIVE mg/dL   Hgb urine dipstick TRACE (*) NEGATIVE   Bilirubin Urine NEGATIVE  NEGATIVE   Ketones, ur NEGATIVE  NEGATIVE mg/dL   Protein, ur NEGATIVE  NEGATIVE mg/dL   Urobilinogen, UA 0.2  0.0 - 1.0 mg/dL   Nitrite NEGATIVE  NEGATIVE   Leukocytes, UA NEGATIVE  NEGATIVE  URINE MICROSCOPIC-ADD ON     Status: Abnormal    Collection Time    03/11/13  9:35 AM      Result Value Range   Squamous Epithelial / LPF MANY (*) RARE   WBC, UA 0-2  <3 WBC/hpf   RBC / HPF 0-2  <3 RBC/hpf   Bacteria, UA FEW (*) RARE  POCT PREGNANCY, URINE     Status: Abnormal   Collection Time    03/11/13  9:46 AM      Result Value Range   Preg Test, Ur POSITIVE (*) NEGATIVE  CBC     Status: Abnormal   Collection Time    03/11/13 10:00 AM      Result Value Range   WBC 13.5 (*) 4.0 - 10.5 K/uL   RBC 4.74  3.87 - 5.11 MIL/uL   Hemoglobin 14.0  12.0 - 15.0 g/dL   HCT 40.3  47.4 - 25.9 %   MCV 87.6  78.0 - 100.0 fL   MCH 29.5  26.0 - 34.0 pg   MCHC 33.7  30.0 - 36.0 g/dL   RDW 56.3  87.5 - 64.3 %   Platelets 326  150 - 400 K/uL  HCG, QUANTITATIVE, PREGNANCY     Status: Abnormal   Collection Time    03/11/13 10:00 AM      Result Value Range   hCG, Beta Chain, Quant, S 208 (*) <5 mIU/mL    Physical Exam   Blood pressure 122/86, pulse 101, temperature 98.8 F (37.1 C), temperature source Oral, resp. rate 18, height 5\' 6"  (1.676 m), weight 97.523 kg (215 lb), last menstrual period 01/29/2013.  Physical Exam  Constitutional: She appears well-developed and well-nourished. No distress.  GI: Soft. She exhibits no distension. There is no tenderness. There is no rebound and no guarding.  Genitourinary: Uterus is not enlarged (Normal shape, size, and consistency). Cervix exhibits no motion tenderness, no discharge and no friability. Right adnexum displays no mass, no tenderness and no fullness. Left adnexum displays no mass, no tenderness and no fullness. Vaginal discharge (Thin, white) found.    MAU Course  Procedures   Assessment and Plan  1. Lower abdominal pain in setting of pregnancy with history of ectopic pregnancy - Urine hCG (positive) and serum hCG (208) - CBC normal - Ultrasound - did not show IUP or ectopic - UA w/ microscopic normal - GC/chlamydia - wet prep normal - Currently unclear if there is an IUP or  ectopic pregnancy. Return in 48 hours for recheck quant hCG. - Return sooner if significant pain or vaginal bleeding occurs.  - Will call if labs show infection.  Lorna Dibble 03/11/2013, 11:21 AM

## 2013-03-13 ENCOUNTER — Inpatient Hospital Stay (HOSPITAL_COMMUNITY)
Admission: AD | Admit: 2013-03-13 | Discharge: 2013-03-13 | Disposition: A | Discharge status: Home / Self Care | Payer: Medicaid Other | Source: Ambulatory Visit | Attending: Obstetrics and Gynecology | Admitting: Obstetrics and Gynecology

## 2013-03-13 DIAGNOSIS — Z3490 Encounter for supervision of normal pregnancy, unspecified, unspecified trimester: Secondary | ICD-10-CM

## 2013-03-13 DIAGNOSIS — O99891 Other specified diseases and conditions complicating pregnancy: Secondary | ICD-10-CM | POA: Insufficient documentation

## 2013-03-13 DIAGNOSIS — Z09 Encounter for follow-up examination after completed treatment for conditions other than malignant neoplasm: Secondary | ICD-10-CM | POA: Insufficient documentation

## 2013-03-13 LAB — HCG, QUANTITATIVE, PREGNANCY: hCG, Beta Chain, Quant, S: 415 m[IU]/mL — ABNORMAL HIGH (ref <5)

## 2013-03-13 NOTE — MAU Provider Note (Signed)
Chart reviewed and agree with management and plan.  

## 2013-03-13 NOTE — MAU Note (Signed)
Patient to MAU for repeat BHCG. Patient denies pain or bleeding. States she does have a little discomfort in the lower abdomen that she would not give a pain scale number.

## 2013-03-13 NOTE — MAU Provider Note (Signed)
  History     CSN: [id number]  Arrival date and time: 03/13/13 1200   None     Chief Complaint  Patient presents with  . Follow-up   HPI  Kathy Hill is 22 y.o. [id number] [redacted]w[redacted]d weeks presenting for follow up BHCG.  She was seen 3/17 with BHCG 208.  Hx of right ectopic with right salpingectomy 3/13 by Dr. Jolayne Panther.  She states [facility] over a year and is trying to get Medicaid.  She denies pain and bleeding today.  She has discomfort but did not think it rated as pain.   Past Medical History  Diagnosis Date  . Ectopic pregnancy   . Urinary tract infection   . Eczema   . Ovarian cyst   . Genital herpes   . Infection     chlamydia  . Gonorrhea   . Trichimoniasis   . BV (bacterial vaginosis)     Past Surgical History  Procedure Laterality Date  . Cesarean section    . Laparoscopy  02/18/2012    Procedure: LAPAROSCOPY OPERATIVE;  Surgeon: Catalina Antigua, MD;  Location: [facility];  Service: Gynecology;  Laterality: N/A;  Lysis of Adhesions, Operative laparoscopy for ectopic pregnancy, left salpingectomy,   . Therapeutic abortion    . Unilateral salpingectomy      right    Family History  Problem Relation Age of Onset  . Anesthesia problems Neg Hx   . Hypotension Neg Hx   . Malignant hyperthermia Neg Hx   . Pseudochol deficiency Neg Hx   . Other Neg Hx   . Thyroid disease Mother   . Kidney disease Father   . Hypertension Father     History  Substance Use Topics  . Smoking status: Never Smoker   . Smokeless tobacco: Never Used  . Alcohol Use: Yes     Comment: rare    Allergies: No Known Allergies  Prescriptions prior to admission  Medication Sig Dispense Refill  . valACYclovir (VALTREX) 500 MG tablet Take 500 mg by mouth daily as needed (with active breakouts).        Review of Systems  Gastrointestinal: Negative for abdominal pain.  Genitourinary:       Negative for vaginal bleeding   Physical Exam   Blood pressure  115/81, pulse 88, temperature 98.7 F (37.1 C), temperature source Oral, resp. rate 16, last menstrual period 01/29/2013, SpO2 99.00%.  Physical Exam  Constitutional: She is oriented to person, place, and time. She appears well-developed and well-nourished. No distress.  Genitourinary:  Not indicated  Neurological: She is alert and oriented to person, place, and time.  Psychiatric: She has a normal mood and affect. Her behavior is normal.    MAU Course  Procedures  MDM Discussed lab results with the patient.  Discussed f/u in 1 week for ultrasound and to return if pain or bleeding occurs before that date Assessment and Plan  A:  Early pregnancy with doubling of the BHCG      Hx of right ectopic and salpingectomy 3/13  P:  Ectopic precautions given     U/S requested for Wednesday 3/26.    KEY,EVE M 03/13/2013, 2:04 PM

## 2013-03-20 ENCOUNTER — Inpatient Hospital Stay (HOSPITAL_COMMUNITY): Admit: 2013-03-20 | Payer: Medicaid Other

## 2013-03-20 ENCOUNTER — Inpatient Hospital Stay (HOSPITAL_COMMUNITY)
Admission: AD | Admit: 2013-03-20 | Discharge: 2013-03-20 | Disposition: A | Discharge status: Home / Self Care | Payer: Medicaid Other | Source: Ambulatory Visit | Attending: Obstetrics & Gynecology | Admitting: Obstetrics & Gynecology

## 2013-03-20 ENCOUNTER — Ambulatory Visit (HOSPITAL_COMMUNITY)
Admission: RE | Admit: 2013-03-20 | Discharge: 2013-03-20 | Disposition: A | Discharge status: Home / Self Care | Payer: Medicaid Other | Source: Ambulatory Visit | Attending: Gynecology | Admitting: Gynecology

## 2013-03-20 ENCOUNTER — Other Ambulatory Visit (HOSPITAL_COMMUNITY): Payer: Self-pay | Admitting: Gynecology

## 2013-03-20 DIAGNOSIS — O99891 Other specified diseases and conditions complicating pregnancy: Secondary | ICD-10-CM | POA: Insufficient documentation

## 2013-03-20 DIAGNOSIS — R102 Pelvic and perineal pain: Secondary | ICD-10-CM

## 2013-03-20 DIAGNOSIS — Z3689 Encounter for other specified antenatal screening: Secondary | ICD-10-CM | POA: Insufficient documentation

## 2013-03-20 DIAGNOSIS — R109 Unspecified abdominal pain: Secondary | ICD-10-CM

## 2013-03-20 DIAGNOSIS — O26899 Other specified pregnancy related conditions, unspecified trimester: Secondary | ICD-10-CM

## 2013-03-20 NOTE — MAU Note (Signed)
Patient to MAU after follow up ultrasound. Patient denies any pain or bleeding at this time.

## 2013-03-20 NOTE — MAU Provider Note (Signed)
  History     CSN: [id number]  Arrival date and time: 03/20/13 2130   First Provider Initiated Contact with Patient 03/20/13 [phone]      Chief Complaint  Patient presents with  . Follow-up   HPI Kathy Hill 22 y.o. [redacted]w[redacted]d  Returns to MAU today after follow ultrasound.  Was here last week and had quant that doubled.  History of previous ectopic pregnancy with removal of tube.  Today is not having any pain or bleeding.  OB History   Grav Para Term Preterm Abortions TAB SAB Ect Mult Living   4 1 1  0 2 1 0 1 0 1      Past Medical History  Diagnosis Date  . Ectopic pregnancy   . Urinary tract infection   . Eczema   . Ovarian cyst   . Genital herpes   . Infection     chlamydia  . Gonorrhea   . Trichimoniasis   . BV (bacterial vaginosis)     Past Surgical History  Procedure Laterality Date  . Cesarean section    . Laparoscopy  02/18/2012    Procedure: LAPAROSCOPY OPERATIVE;  Surgeon: Catalina Antigua, MD;  Location: [facility];  Service: Gynecology;  Laterality: N/A;  Lysis of Adhesions, Operative laparoscopy for ectopic pregnancy, left salpingectomy,   . Therapeutic abortion    . Unilateral salpingectomy      right    Family History  Problem Relation Age of Onset  . Anesthesia problems Neg Hx   . Hypotension Neg Hx   . Malignant hyperthermia Neg Hx   . Pseudochol deficiency Neg Hx   . Other Neg Hx   . Thyroid disease Mother   . Kidney disease Father   . Hypertension Father     History  Substance Use Topics  . Smoking status: Never Smoker   . Smokeless tobacco: Never Used  . Alcohol Use: Yes     Comment: rare    Allergies: No Known Allergies  Prescriptions prior to admission  Medication Sig Dispense Refill  . valACYclovir (VALTREX) 500 MG tablet Take 500 mg by mouth daily as needed (with active breakouts).        Review of Systems  Gastrointestinal: Negative for nausea, vomiting and abdominal pain.  Genitourinary:       No vaginal bleeding.    Physical Exam   Blood pressure 123/78, pulse 86, temperature 98 F (36.7 C), temperature source Oral, resp. rate 16, last menstrual period 01/29/2013, SpO2 100.00%.  Physical Exam  Nursing note and vitals reviewed. Constitutional: She is oriented to person, place, and time. She appears well-developed and well-nourished.  HENT:  Head: Normocephalic.  Eyes: EOM are normal.  Neck: Neck supple.  Musculoskeletal: Normal range of motion.  Neurological: She is alert and oriented to person, place, and time.  Skin: Skin is warm and dry.  Psychiatric: She has a normal mood and affect.    MAU Course  Procedures  MDM    Consult with Dr. Debroah Loop re: plan of care  Assessment and Plan  Early pregnancy - unable to confirm IUP with yolk sac  Plan Ultrasound will call client and schedule appointment in one week. Verification of pregnancy form given.  Kathy Hill 03/20/2013, 9:18 AM

## 2013-03-27 ENCOUNTER — Encounter (HOSPITAL_COMMUNITY): Payer: Self-pay

## 2013-03-27 ENCOUNTER — Other Ambulatory Visit (HOSPITAL_COMMUNITY): Payer: Self-pay | Admitting: Nurse Practitioner

## 2013-03-27 ENCOUNTER — Ambulatory Visit (HOSPITAL_COMMUNITY)
Admission: RE | Admit: 2013-03-27 | Discharge: 2013-03-27 | Disposition: A | Discharge status: Home / Self Care | Payer: Medicaid Other | Source: Ambulatory Visit | Attending: Nurse Practitioner | Admitting: Nurse Practitioner

## 2013-03-27 ENCOUNTER — Inpatient Hospital Stay (HOSPITAL_COMMUNITY)
Admission: AD | Admit: 2013-03-27 | Discharge: 2013-03-27 | Disposition: A | Discharge status: Home / Self Care | Payer: Medicaid Other | Source: Ambulatory Visit | Attending: Obstetrics & Gynecology | Admitting: Obstetrics & Gynecology

## 2013-03-27 DIAGNOSIS — Z1389 Encounter for screening for other disorder: Secondary | ICD-10-CM

## 2013-03-27 DIAGNOSIS — Z3490 Encounter for supervision of normal pregnancy, unspecified, unspecified trimester: Secondary | ICD-10-CM

## 2013-03-27 DIAGNOSIS — O3680X2 Pregnancy with inconclusive fetal viability, fetus 2: Secondary | ICD-10-CM

## 2013-03-27 DIAGNOSIS — N831 Corpus luteum cyst of ovary, unspecified side: Secondary | ICD-10-CM | POA: Insufficient documentation

## 2013-03-27 DIAGNOSIS — R109 Unspecified abdominal pain: Secondary | ICD-10-CM | POA: Insufficient documentation

## 2013-03-27 DIAGNOSIS — O34599 Maternal care for other abnormalities of gravid uterus, unspecified trimester: Secondary | ICD-10-CM | POA: Insufficient documentation

## 2013-03-27 DIAGNOSIS — O3680X0 Pregnancy with inconclusive fetal viability, not applicable or unspecified: Secondary | ICD-10-CM | POA: Insufficient documentation

## 2013-03-27 NOTE — MAU Provider Note (Signed)
  History     CSN: [id number]  Arrival date and time: 03/27/13 1509   None     Chief Complaint  Patient presents with  . F/U u/s for viability    HPI This is a 22 y.o. female at [redacted]w[redacted]d who presents for Ultrasound confirmation of pregnancy. Denies bleeding but does have mild cramps every now and then.   RN Note: Pt here for f/u u/s for viability. Denies bleeding, has intermittent abd pains-mild.       OB History   Grav Para Term Preterm Abortions TAB SAB Ect Mult Living   4 1 1  0 2 1 0 1 0 1      Past Medical History  Diagnosis Date  . Ectopic pregnancy   . Urinary tract infection   . Eczema   . Ovarian cyst   . Genital herpes   . Infection     chlamydia  . Gonorrhea   . Trichimoniasis   . BV (bacterial vaginosis)     Past Surgical History  Procedure Laterality Date  . Cesarean section    . Laparoscopy  02/18/2012    Procedure: LAPAROSCOPY OPERATIVE;  Surgeon: Catalina Antigua, MD;  Location: [facility];  Service: Gynecology;  Laterality: N/A;  Lysis of Adhesions, Operative laparoscopy for ectopic pregnancy, left salpingectomy,   . Therapeutic abortion    . Unilateral salpingectomy      right    Family History  Problem Relation Age of Onset  . Anesthesia problems Neg Hx   . Hypotension Neg Hx   . Malignant hyperthermia Neg Hx   . Pseudochol deficiency Neg Hx   . Other Neg Hx   . Thyroid disease Mother   . Kidney disease Father   . Hypertension Father     History  Substance Use Topics  . Smoking status: Never Smoker   . Smokeless tobacco: Never Used  . Alcohol Use: Yes     Comment: rare    Allergies: No Known Allergies  Prescriptions prior to admission  Medication Sig Dispense Refill  . valACYclovir (VALTREX) 500 MG tablet Take 500 mg by mouth daily as needed (with active breakouts).        Review of Systems  Constitutional: Negative for fever and chills.  Gastrointestinal: Negative for nausea, vomiting and abdominal pain.  Genitourinary: Negative  for dysuria.  Neurological: Negative for dizziness.   Physical Exam   Resp. rate 16, last menstrual period 01/29/2013, not currently breastfeeding.  Physical Exam  Constitutional: She is oriented to person, place, and time. She appears well-developed and well-nourished. No distress.  HENT:  Head: Normocephalic.  Cardiovascular: Normal rate.   Respiratory: Effort normal.  Musculoskeletal: Normal range of motion.  Neurological: She is alert and oriented to person, place, and time.  Skin: Skin is warm and dry.  Psychiatric: She has a normal mood and affect.    MAU Course  Procedures  MDM   Assessment and Plan  A:  SIUP at [redacted]w[redacted]d       Left large corpus luteum cyst  P:  Reviewed results with patient      Discharge home      Resources list for prenatal care given  [facility] 03/27/2013, 4:12 PM

## 2013-03-27 NOTE — MAU Note (Signed)
Pt here for f/u u/s for viability. Denies bleeding, has intermittent abd pains-mild.

## 2013-03-29 NOTE — MAU Provider Note (Signed)
Attestation of Attending Supervision of Advanced Practitioner (PA/CNM/NP): Evaluation and management procedures were performed by the Advanced Practitioner under my supervision and collaboration.  I have reviewed the Advanced Practitioner's note and chart, and I agree with the management and plan.  Mckale Haffey, MD, FACOG Attending Obstetrician & Gynecologist Faculty Practice, [facility]  

## 2013-04-05 ENCOUNTER — Encounter: Payer: Self-pay | Admitting: Obstetrics & Gynecology

## 2013-05-06 ENCOUNTER — Encounter: Payer: Self-pay | Admitting: Obstetrics

## 2013-05-06 LAB — OB RESULTS CONSOLE HEPATITIS B SURFACE ANTIGEN: Hepatitis B Surface Ag: NEGATIVE

## 2013-05-06 LAB — OB RESULTS CONSOLE RUBELLA ANTIBODY, IGM: Rubella: IMMUNE

## 2013-05-27 ENCOUNTER — Encounter: Payer: Self-pay | Admitting: Obstetrics & Gynecology

## 2013-09-13 ENCOUNTER — Encounter (HOSPITAL_COMMUNITY): Payer: Self-pay | Admitting: *Deleted

## 2013-09-13 ENCOUNTER — Inpatient Hospital Stay (HOSPITAL_COMMUNITY)
Admission: AD | Admit: 2013-09-13 | Discharge: 2013-09-13 | Disposition: A | Discharge status: Home / Self Care | Payer: Medicaid Other | Source: Ambulatory Visit | Attending: Obstetrics and Gynecology | Admitting: Obstetrics and Gynecology

## 2013-09-13 DIAGNOSIS — O4700 False labor before 37 completed weeks of gestation, unspecified trimester: Secondary | ICD-10-CM | POA: Insufficient documentation

## 2013-09-13 DIAGNOSIS — N859 Noninflammatory disorder of uterus, unspecified: Secondary | ICD-10-CM

## 2013-09-13 DIAGNOSIS — N858 Other specified noninflammatory disorders of uterus: Secondary | ICD-10-CM

## 2013-09-13 DIAGNOSIS — R109 Unspecified abdominal pain: Secondary | ICD-10-CM | POA: Insufficient documentation

## 2013-09-13 LAB — FETAL FIBRONECTIN: Fetal Fibronectin: NEGATIVE

## 2013-09-13 LAB — URINALYSIS, ROUTINE W REFLEX MICROSCOPIC
Bilirubin Urine: NEGATIVE
Nitrite: NEGATIVE
Protein, ur: NEGATIVE mg/dL
Specific Gravity, Urine: >1.03 — ABNORMAL HIGH (ref 1.005–1.030)
Urobilinogen, UA: 0.2 mg/dL (ref 0.0–1.0)

## 2013-09-13 MED ORDER — NIFEDIPINE 10 MG PO CAPS
10.0000 mg | ORAL_CAPSULE | Freq: Once | ORAL | Status: AC
  Administered 2013-09-13: 10 mg via ORAL
  Filled 2013-09-13: qty 1

## 2013-09-13 NOTE — MAU Provider Note (Signed)
History     CSN: [id number]  Arrival date and time: 09/13/13 1415   First Provider Initiated Contact with Patient 09/13/13 1525      Chief Complaint  Patient presents with  . Abdominal Pain   HPI This is a 22 y.o. female at [redacted]w[redacted]d who presents with c/o lower abdominal pain which comes and goes. It started at work and got worse. Denies leaking or bleeding. Denies history of PTL  RN Note: Pt presents with complaints of lower abdominal pain. States that she was at work today and the pain kept getting worse. Denies any bleeding or discharge      OB History   Grav Para Term Preterm Abortions TAB SAB Ect Mult Living   4 1 1  0 2 1 0 1 0 1      Past Medical History  Diagnosis Date  . Ectopic pregnancy   . Urinary tract infection   . Eczema   . Ovarian cyst   . Genital herpes   . Infection     chlamydia  . Gonorrhea   . Trichimoniasis   . BV (bacterial vaginosis)     Past Surgical History  Procedure Laterality Date  . Cesarean section    . Laparoscopy  02/18/2012    Procedure: LAPAROSCOPY OPERATIVE;  Surgeon: Catalina Antigua, MD;  Location: [facility];  Service: Gynecology;  Laterality: N/A;  Lysis of Adhesions, Operative laparoscopy for ectopic pregnancy, left salpingectomy,   . Therapeutic abortion    . Unilateral salpingectomy      right    Family History  Problem Relation Age of Onset  . Anesthesia problems Neg Hx   . Hypotension Neg Hx   . Malignant hyperthermia Neg Hx   . Pseudochol deficiency Neg Hx   . Other Neg Hx   . Thyroid disease Mother   . Kidney disease Father   . Hypertension Father     History  Substance Use Topics  . Smoking status: Never Smoker   . Smokeless tobacco: Never Used  . Alcohol Use: Yes     Comment: rare    Allergies: No Known Allergies  Prescriptions prior to admission  Medication Sig Dispense Refill  . docusate sodium (COLACE) 100 MG capsule Take 100 mg by mouth 2 (two) times daily as needed for constipation.      . Prenatal  Vit-Fe Fumarate-FA (PRENATAL MULTIVITAMIN) TABS tablet Take 1 tablet by mouth at bedtime.      . valACYclovir (VALTREX) 500 MG tablet Take 500 mg by mouth daily as needed (with active breakouts).        Review of Systems  Constitutional: Negative for fever, chills and malaise/fatigue.  Gastrointestinal: Positive for abdominal pain. Negative for nausea, vomiting and diarrhea.  Genitourinary: Negative for dysuria.  Musculoskeletal: Negative for myalgias and back pain.  Neurological: Negative for dizziness.   Physical Exam   Blood pressure 126/80, pulse 109, temperature 98.4 F (36.9 C), temperature source Oral, resp. rate 16, last menstrual period 01/29/2013.  Physical Exam  Constitutional: She is oriented to person, place, and time. She appears well-developed and well-nourished. No distress.  HENT:  Head: Normocephalic.  Cardiovascular: Normal rate.   Respiratory: Effort normal.  GI: Soft. She exhibits no distension and no mass. There is no tenderness. There is no rebound and no guarding.  Genitourinary: Vagina normal and uterus normal. No vaginal discharge found.  Fetal heart rate reactive Uterine irritability  Cervix long and closed FFn collected  Musculoskeletal: Normal range of motion.  Neurological:  She is alert and oriented to person, place, and time.  Skin: Skin is warm and dry.  Psychiatric: She has a normal mood and affect.    MAU Course  Procedures  MDM Discussed with Dr Normand Sloop. FFn sent. Will give a dose of Procardia. If FFn negative and cramping stops may discharge home  >>  Cramping subsided substantially but there were still a few cramps, so I gave her a second dose.   FFN was NEGATIVE   Assessment and Plan  A:  SIUP at [redacted]w[redacted]d       Uterine irritability without cervical change and FFN negative  P:  Discharge home      Strict PTL precautions given      Come back as needed      Followup in office  [facility] 09/13/2013, 3:27 PM

## 2013-09-13 NOTE — MAU Note (Signed)
Pt presents with complaints of lower abdominal pain. States that she was at work today and the pain kept getting worse. Denies any bleeding or discharge

## 2013-09-13 NOTE — Progress Notes (Signed)
Dr Normand Sloop notified of pt's complaints, FHR pattern, contraction pattern tracing, ask if MAU provider could evaluate pt.

## 2013-11-13 ENCOUNTER — Other Ambulatory Visit: Payer: Self-pay | Admitting: Obstetrics and Gynecology

## 2013-11-13 NOTE — Patient Instructions (Addendum)
   Your procedure is scheduled on:  Friday, Nov 21  Enter through the [employer] of [facility] at:  930 AM Pick up the phone at the desk and dial [phone] and inform us of your arrival.  Please call this number if you have any problems the morning of surgery: [phone]  Remember: Do not eat or drink after midnight: Thursday Take these medicines the morning of surgery with a SIP OF WATER:  None  Do not wear jewelry, make-up, or FINGER nail polish No metal in your hair or on your body. Do not wear lotions, powders, perfumes. You may wear deodorant.  Please use your CHG wash as directed prior to surgery.  Do not shave anywhere for at least 12 hours prior to first CHG shower.  Do not bring valuables to the hospital. Contacts, dentures or bridgework may not be worn into surgery.  Leave suitcase in the car. After Surgery it may be brought to your room. For patients being admitted to the hospital, checkout time is 11:00am the day of discharge.  Home with [facility] cell [phone] or [facility] cell [phone].

## 2013-11-14 ENCOUNTER — Encounter (HOSPITAL_COMMUNITY)
Admission: RE | Admit: 2013-11-14 | Discharge: 2013-11-14 | Disposition: A | Discharge status: Home / Self Care | Payer: Medicaid Other | Source: Ambulatory Visit | Attending: Obstetrics and Gynecology | Admitting: Obstetrics and Gynecology

## 2013-11-14 ENCOUNTER — Encounter (HOSPITAL_COMMUNITY): Payer: Self-pay

## 2013-11-14 LAB — CBC
HCT: 36.1 % (ref 36.0–46.0)
Hemoglobin: 12 g/dL (ref 12.0–15.0)
MCHC: 33.2 g/dL (ref 30.0–36.0)
Platelets: 232 10*3/uL (ref 150–400)
RBC: 4.37 MIL/uL (ref 3.87–5.11)

## 2013-11-14 LAB — RPR: RPR Ser Ql: NONREACTIVE

## 2013-11-14 NOTE — H&P (Signed)
Admission History and Physical Exam for an Obstetrics Patient  Kathy Hill is a 22 y.o. female, [id number], at [redacted]w[redacted]d gestation, who presents for a repeat cesarean section. She has been followed at the [facility] and Gy[facility]cology division of [facility].  Her pregnancy has been complicated by a history of a prior cesarean delivery. See history below.  OB History   Grav Para Term Preterm Abortions TAB SAB Ect Mult Living   4 1 1  0 2 1 0 1 0 1      Past Medical History  Diagnosis Date  . Ectopic pregnancy   . Urinary tract infection     history only  . Eczema   . Ovarian cyst   . Genital herpes   . Infection     chlamydia history only  . Gonorrhea     history only  . Trichimoniasis     history only  . BV (bacterial vaginosis)     history only    Medications: Valtrex, prenatal vitamins  Past Surgical History  Procedure Laterality Date  . Cesarean section  2011    [facility]  . Laparoscopy  02/18/2012    Procedure: LAPAROSCOPY OPERATIVE;  Surgeon: Catalina Antigua, MD;  Location: [facility];  Service: Gynecology;  Laterality: N/A;  Lysis of Adhesions, Operative laparoscopy for ectopic pregnancy, left salpingectomy,   . Therapeutic abortion    . Unilateral salpingectomy      right    No Known Allergies  Family History: family history includes Hypertension in her father; Kidney disease in her father; Thyroid disease in her mother. There is no history of Anesthesia problems, Hypotension, Malignant hyperthermia, Pseudochol deficiency, or Other.  Social History:  reports that she has never smoked. She has never used smokeless tobacco. She reports that she drinks alcohol. She reports that she does not use illicit drugs.  Review of systems: Normal pregnancy complaints.  Admission Physical Exam:    weight: 247 pounds  Last menstrual period 01/29/2013.  HEENT:                 Within normal limits Chest:                   Clear Heart:                     Regular rate and rhythm Abdomen:             Gravid and nontender Extremities:          Grossly normal Neurologic exam: Grossly normal Pelvic exam:         Cervix: closed  Prenatal labs: ABO, Rh:             --/--/O POS (11/20 0910) HBsAg:                 Negative (05/12 0000)  HIV:                         nonreactive GBS:                      negative Antibody:              NEG (11/20 0910) Rubella:                 immune RPR:                    NON  REACTIVE (11/20 0910)      Prenatal Transfer Tool  Maternal Diabetes: No Genetic Screening: Normal Maternal Ultrasounds/Referrals: Normal Fetal Ultrasounds or other Referrals:  None Maternal Substance Abuse:  No Significant Maternal Medications:  Bowel tracks Significant Maternal Lab Results:  Positive herpes test Other Comments:  the patient has received her flu shot and her TDAP vaccination  Assessment:  [redacted]w[redacted]d gestation  Prior cesarean section  Desires repeat cesarean section  Obesity  History of herpes  Plan:  The patient will undergo a repeat low transverse cesarean section.  She understands the indications for her surgical procedure.  She accepts the risk of, but not limited to, anesthetic complications, bleeding, infection, and possible damage to surrounding organs.   Janine Limbo 11/14/2013, 7:28 PM

## 2013-11-15 ENCOUNTER — Encounter (HOSPITAL_COMMUNITY): Payer: Self-pay | Admitting: *Deleted

## 2013-11-15 ENCOUNTER — Encounter (HOSPITAL_COMMUNITY)
Admission: AD | Disposition: A | Discharge status: Home / Self Care | Payer: Self-pay | Source: Ambulatory Visit | Attending: Obstetrics and Gynecology

## 2013-11-15 ENCOUNTER — Inpatient Hospital Stay (HOSPITAL_COMMUNITY)
Admission: AD | Admit: 2013-11-15 | Discharge: 2013-11-18 | DRG: 765 | Disposition: A | Discharge status: Home / Self Care | Payer: Medicaid Other | Source: Ambulatory Visit | Attending: Obstetrics and Gynecology | Admitting: Obstetrics and Gynecology

## 2013-11-15 ENCOUNTER — Inpatient Hospital Stay (HOSPITAL_COMMUNITY): Payer: Medicaid Other | Admitting: Anesthesiology

## 2013-11-15 ENCOUNTER — Encounter (HOSPITAL_COMMUNITY): Payer: Self-pay | Admitting: Pharmacy Technician

## 2013-11-15 ENCOUNTER — Encounter (HOSPITAL_COMMUNITY): Payer: Medicaid Other | Admitting: Anesthesiology

## 2013-11-15 DIAGNOSIS — D649 Anemia, unspecified: Secondary | ICD-10-CM | POA: Diagnosis not present

## 2013-11-15 DIAGNOSIS — E669 Obesity, unspecified: Secondary | ICD-10-CM | POA: Diagnosis present

## 2013-11-15 DIAGNOSIS — O9903 Anemia complicating the puerperium: Secondary | ICD-10-CM | POA: Diagnosis not present

## 2013-11-15 DIAGNOSIS — O98519 Other viral diseases complicating pregnancy, unspecified trimester: Secondary | ICD-10-CM | POA: Diagnosis present

## 2013-11-15 DIAGNOSIS — Z98891 History of uterine scar from previous surgery: Secondary | ICD-10-CM

## 2013-11-15 DIAGNOSIS — A6000 Herpesviral infection of urogenital system, unspecified: Secondary | ICD-10-CM | POA: Diagnosis present

## 2013-11-15 DIAGNOSIS — O34219 Maternal care for unspecified type scar from previous cesarean delivery: Principal | ICD-10-CM | POA: Diagnosis present

## 2013-11-15 SURGERY — Surgical Case
Anesthesia: Spinal | Site: Abdomen | Wound class: Clean Contaminated

## 2013-11-15 MED ORDER — OXYTOCIN 10 UNIT/ML IJ SOLN
INTRAMUSCULAR | Status: AC
  Filled 2013-11-15: qty 4

## 2013-11-15 MED ORDER — MEDROXYPROGESTERONE ACETATE 150 MG/ML IM SUSP: 150.0000 mg | INTRAMUSCULAR | Status: DC | PRN

## 2013-11-15 MED ORDER — CEFAZOLIN SODIUM-DEXTROSE 2-3 GM-% IV SOLR
2.0000 g | INTRAVENOUS | Status: AC
  Administered 2013-11-15: 2 g via INTRAVENOUS

## 2013-11-15 MED ORDER — KETOROLAC TROMETHAMINE 30 MG/ML IJ SOLN
30.0000 mg | Freq: Once | INTRAMUSCULAR | Status: AC
  Administered 2013-11-15: 30 mg via INTRAMUSCULAR

## 2013-11-15 MED ORDER — MORPHINE SULFATE 0.5 MG/ML IJ SOLN
INTRAMUSCULAR | Status: AC
  Filled 2013-11-15: qty 10

## 2013-11-15 MED ORDER — BUPIVACAINE-EPINEPHRINE 0.5% -1:200000 IJ SOLN
INTRAMUSCULAR | Status: DC | PRN
  Administered 2013-11-15: 10 mL

## 2013-11-15 MED ORDER — HYDROMORPHONE HCL PF 1 MG/ML IJ SOLN: 0.2500 mg | INTRAMUSCULAR | Status: DC | PRN

## 2013-11-15 MED ORDER — HYDROMORPHONE HCL PF 1 MG/ML IJ SOLN
INTRAMUSCULAR | Status: AC
  Filled 2013-11-15: qty 1

## 2013-11-15 MED ORDER — BUPIVACAINE-EPINEPHRINE (PF) 0.5% -1:200000 IJ SOLN
INTRAMUSCULAR | Status: AC
  Filled 2013-11-15: qty 10

## 2013-11-15 MED ORDER — ATROPINE SULFATE 0.4 MG/ML IJ SOLN
INTRAMUSCULAR | Status: AC
  Filled 2013-11-15: qty 1

## 2013-11-15 MED ORDER — DIPHENHYDRAMINE HCL 25 MG PO CAPS
25.0000 mg | ORAL_CAPSULE | Freq: Four times a day (QID) | ORAL | Status: DC | PRN
  Filled 2013-11-15: qty 1

## 2013-11-15 MED ORDER — DIPHENHYDRAMINE HCL 50 MG/ML IJ SOLN: 25.0000 mg | INTRAMUSCULAR | Status: DC | PRN

## 2013-11-15 MED ORDER — LANOLIN HYDROUS EX OINT: 1.0000 "application " | TOPICAL_OINTMENT | CUTANEOUS | Status: DC | PRN

## 2013-11-15 MED ORDER — WITCH HAZEL-GLYCERIN EX PADS: 1.0000 "application " | MEDICATED_PAD | CUTANEOUS | Status: DC | PRN

## 2013-11-15 MED ORDER — PHENYLEPHRINE HCL 10 MG/ML IJ SOLN
INTRAMUSCULAR | Status: DC | PRN
  Administered 2013-11-15: 80 ug via INTRAVENOUS

## 2013-11-15 MED ORDER — SCOPOLAMINE 1 MG/3DAYS TD PT72
MEDICATED_PATCH | TRANSDERMAL | Status: AC
  Filled 2013-11-15: qty 1

## 2013-11-15 MED ORDER — DIPHENHYDRAMINE HCL 50 MG/ML IJ SOLN
12.5000 mg | INTRAMUSCULAR | Status: DC | PRN
  Administered 2013-11-15: 12.5 mg via INTRAVENOUS
  Filled 2013-11-15: qty 1

## 2013-11-15 MED ORDER — FENTANYL CITRATE 0.05 MG/ML IJ SOLN
INTRAMUSCULAR | Status: AC
  Filled 2013-11-15: qty 2

## 2013-11-15 MED ORDER — ONDANSETRON HCL 4 MG PO TABS: 4.0000 mg | ORAL_TABLET | ORAL | Status: DC | PRN

## 2013-11-15 MED ORDER — SIMETHICONE 80 MG PO CHEW
80.0000 mg | CHEWABLE_TABLET | ORAL | Status: DC | PRN
  Filled 2013-11-15: qty 1

## 2013-11-15 MED ORDER — NALBUPHINE HCL 10 MG/ML IJ SOLN
5.0000 mg | INTRAMUSCULAR | Status: DC | PRN
  Administered 2013-11-15: 10 mg via SUBCUTANEOUS

## 2013-11-15 MED ORDER — MENTHOL 3 MG MT LOZG
1.0000 | LOZENGE | OROMUCOSAL | Status: DC | PRN
  Filled 2013-11-15: qty 9

## 2013-11-15 MED ORDER — KETOROLAC TROMETHAMINE 30 MG/ML IJ SOLN
INTRAMUSCULAR | Status: AC
  Administered 2013-11-15: 30 mg via INTRAMUSCULAR
  Filled 2013-11-15: qty 1

## 2013-11-15 MED ORDER — PRENATAL MULTIVITAMIN CH
1.0000 | ORAL_TABLET | Freq: Every day | ORAL | Status: DC
  Administered 2013-11-16 – 2013-11-18 (×3): 1 via ORAL
  Filled 2013-11-15 (×3): qty 1

## 2013-11-15 MED ORDER — MORPHINE SULFATE (PF) 0.5 MG/ML IJ SOLN
INTRAMUSCULAR | Status: DC | PRN
  Administered 2013-11-15: .15 ug via INTRATHECAL

## 2013-11-15 MED ORDER — PHENYLEPHRINE 8 MG IN D5W 100 ML (0.08MG/ML) PREMIX OPTIME
INJECTION | INTRAVENOUS | Status: AC
  Filled 2013-11-15: qty 100

## 2013-11-15 MED ORDER — MEASLES, MUMPS & RUBELLA VAC ~~LOC~~ INJ
0.5000 mL | INJECTION | Freq: Once | SUBCUTANEOUS | Status: DC
  Filled 2013-11-15: qty 0.5

## 2013-11-15 MED ORDER — NALBUPHINE HCL 10 MG/ML IJ SOLN
5.0000 mg | INTRAMUSCULAR | Status: DC | PRN
  Administered 2013-11-16: 10 mg via INTRAVENOUS
  Filled 2013-11-15: qty 1

## 2013-11-15 MED ORDER — OXYTOCIN 40 UNITS IN LACTATED RINGERS INFUSION - SIMPLE MED: 62.5000 mL/h | INTRAVENOUS | Status: AC

## 2013-11-15 MED ORDER — ONDANSETRON HCL 4 MG/2ML IJ SOLN
INTRAMUSCULAR | Status: DC | PRN
  Administered 2013-11-15: 4 mg via INTRAVENOUS

## 2013-11-15 MED ORDER — NALOXONE HCL 0.4 MG/ML IJ SOLN: 0.4000 mg | INTRAMUSCULAR | Status: DC | PRN

## 2013-11-15 MED ORDER — SCOPOLAMINE 1 MG/3DAYS TD PT72: 1.0000 | MEDICATED_PATCH | Freq: Once | TRANSDERMAL | Status: DC

## 2013-11-15 MED ORDER — OXYTOCIN 10 UNIT/ML IJ SOLN
40.0000 [IU] | INTRAVENOUS | Status: DC | PRN
  Administered 2013-11-15: 40 [IU] via INTRAVENOUS

## 2013-11-15 MED ORDER — ONDANSETRON HCL 4 MG/2ML IJ SOLN
INTRAMUSCULAR | Status: AC
  Filled 2013-11-15: qty 2

## 2013-11-15 MED ORDER — OXYCODONE-ACETAMINOPHEN 5-325 MG PO TABS
1.0000 | ORAL_TABLET | ORAL | Status: DC | PRN
  Administered 2013-11-16 (×3): 1 via ORAL
  Administered 2013-11-17: 2 via ORAL
  Administered 2013-11-17 (×3): 1 via ORAL
  Administered 2013-11-17 – 2013-11-18 (×2): 2 via ORAL
  Administered 2013-11-18: 1 via ORAL
  Administered 2013-11-18: 2 via ORAL
  Filled 2013-11-15 (×2): qty 1
  Filled 2013-11-15 (×2): qty 2
  Filled 2013-11-15 (×2): qty 1
  Filled 2013-11-15: qty 2
  Filled 2013-11-15 (×2): qty 1
  Filled 2013-11-15: qty 2
  Filled 2013-11-15: qty 1

## 2013-11-15 MED ORDER — NALBUPHINE SYRINGE 5 MG/0.5 ML
INJECTION | INTRAMUSCULAR | Status: AC
  Administered 2013-11-15: 10 mg via SUBCUTANEOUS
  Filled 2013-11-15: qty 0.5

## 2013-11-15 MED ORDER — SIMETHICONE 80 MG PO CHEW
80.0000 mg | CHEWABLE_TABLET | ORAL | Status: DC
  Administered 2013-11-16 – 2013-11-18 (×3): 80 mg via ORAL
  Filled 2013-11-15 (×3): qty 1

## 2013-11-15 MED ORDER — SODIUM CHLORIDE 0.9 % IJ SOLN: 3.0000 mL | INTRAMUSCULAR | Status: DC | PRN

## 2013-11-15 MED ORDER — LACTATED RINGERS IV SOLN
INTRAVENOUS | Status: DC
  Administered 2013-11-15 (×2): via INTRAVENOUS
  Administered 2013-11-15: 100 mL/h via INTRAVENOUS
  Administered 2013-11-15: 11:00:00 via INTRAVENOUS

## 2013-11-15 MED ORDER — DIPHENHYDRAMINE HCL 25 MG PO CAPS
25.0000 mg | ORAL_CAPSULE | ORAL | Status: DC | PRN
  Administered 2013-11-16 (×2): 25 mg via ORAL
  Filled 2013-11-15: qty 1

## 2013-11-15 MED ORDER — ONDANSETRON HCL 4 MG/2ML IJ SOLN: 4.0000 mg | Freq: Three times a day (TID) | INTRAMUSCULAR | Status: DC | PRN

## 2013-11-15 MED ORDER — EPHEDRINE SULFATE 50 MG/ML IJ SOLN
INTRAMUSCULAR | Status: DC | PRN
  Administered 2013-11-15: 15 mg via INTRAVENOUS
  Administered 2013-11-15: 10 mg via INTRAVENOUS
  Administered 2013-11-15: 15 mg via INTRAVENOUS
  Administered 2013-11-15 (×2): 10 mg via INTRAVENOUS

## 2013-11-15 MED ORDER — IBUPROFEN 600 MG PO TABS
600.0000 mg | ORAL_TABLET | Freq: Four times a day (QID) | ORAL | Status: DC
  Administered 2013-11-16 – 2013-11-18 (×11): 600 mg via ORAL
  Filled 2013-11-15 (×11): qty 1

## 2013-11-15 MED ORDER — NALOXONE HCL 1 MG/ML IJ SOLN: 1.0000 ug/kg/h | INTRAMUSCULAR | Status: DC | PRN

## 2013-11-15 MED ORDER — LACTATED RINGERS IV SOLN
INTRAVENOUS | Status: DC
  Administered 2013-11-16: 01:00:00 via INTRAVENOUS

## 2013-11-15 MED ORDER — EPHEDRINE 5 MG/ML INJ
INTRAVENOUS | Status: AC
  Filled 2013-11-15: qty 20

## 2013-11-15 MED ORDER — MEPERIDINE HCL 25 MG/ML IJ SOLN: 6.2500 mg | INTRAMUSCULAR | Status: DC | PRN

## 2013-11-15 MED ORDER — SIMETHICONE 80 MG PO CHEW
80.0000 mg | CHEWABLE_TABLET | Freq: Three times a day (TID) | ORAL | Status: DC
  Administered 2013-11-16 – 2013-11-18 (×7): 80 mg via ORAL
  Filled 2013-11-15 (×7): qty 1

## 2013-11-15 MED ORDER — DIBUCAINE 1 % RE OINT: 1.0000 "application " | TOPICAL_OINTMENT | RECTAL | Status: DC | PRN

## 2013-11-15 MED ORDER — TETANUS-DIPHTH-ACELL PERTUSSIS 5-2.5-18.5 LF-MCG/0.5 IM SUSP: 0.5000 mL | Freq: Once | INTRAMUSCULAR | Status: DC

## 2013-11-15 MED ORDER — SCOPOLAMINE 1 MG/3DAYS TD PT72
1.0000 | MEDICATED_PATCH | Freq: Once | TRANSDERMAL | Status: AC
  Administered 2013-11-15: 1.5 mg via TRANSDERMAL

## 2013-11-15 MED ORDER — METOCLOPRAMIDE HCL 5 MG/ML IJ SOLN: 10.0000 mg | Freq: Three times a day (TID) | INTRAMUSCULAR | Status: DC | PRN

## 2013-11-15 MED ORDER — FENTANYL CITRATE 0.05 MG/ML IJ SOLN
INTRAMUSCULAR | Status: DC | PRN
  Administered 2013-11-15: 25 ug via INTRATHECAL

## 2013-11-15 MED ORDER — HYDROMORPHONE HCL PF 1 MG/ML IJ SOLN
INTRAMUSCULAR | Status: DC | PRN
  Administered 2013-11-15: .5 mg via INTRAVENOUS

## 2013-11-15 MED ORDER — PHENYLEPHRINE 8 MG IN D5W 100 ML (0.08MG/ML) PREMIX OPTIME
INJECTION | INTRAVENOUS | Status: DC | PRN
  Administered 2013-11-15: 60 ug/min via INTRAVENOUS

## 2013-11-15 MED ORDER — ONDANSETRON HCL 4 MG/2ML IJ SOLN: 4.0000 mg | INTRAMUSCULAR | Status: DC | PRN

## 2013-11-15 MED ORDER — ZOLPIDEM TARTRATE 5 MG PO TABS: 5.0000 mg | ORAL_TABLET | Freq: Every evening | ORAL | Status: DC | PRN

## 2013-11-15 MED ORDER — SENNOSIDES-DOCUSATE SODIUM 8.6-50 MG PO TABS
2.0000 | ORAL_TABLET | ORAL | Status: DC
  Administered 2013-11-16 – 2013-11-18 (×2): 2 via ORAL
  Filled 2013-11-15 (×2): qty 2

## 2013-11-15 MED ORDER — CEFAZOLIN SODIUM-DEXTROSE 2-3 GM-% IV SOLR
INTRAVENOUS | Status: AC
  Filled 2013-11-15: qty 50

## 2013-11-15 SURGICAL SUPPLY — 37 items
CLAMP CORD UMBIL (MISCELLANEOUS) IMPLANT
CLOTH BEACON ORANGE TIMEOUT ST (SAFETY) ×2 IMPLANT
CONTAINER PREFILL 10% NBF 15ML (MISCELLANEOUS) IMPLANT
DRAIN JACKSON PRT FLT 7MM (DRAIN) IMPLANT
DRAPE LG THREE QUARTER DISP (DRAPES) IMPLANT
DRSG OPSITE POSTOP 4X10 (GAUZE/BANDAGES/DRESSINGS) ×2 IMPLANT
DURAPREP 26ML APPLICATOR (WOUND CARE) ×2 IMPLANT
ELECT REM PT RETURN 9FT ADLT (ELECTROSURGICAL) ×2
ELECTRODE REM PT RTRN 9FT ADLT (ELECTROSURGICAL) ×1 IMPLANT
EVACUATOR SILICONE 100CC (DRAIN) IMPLANT
EXTRACTOR VACUUM M CUP 4 TUBE (SUCTIONS) IMPLANT
GLOVE BIOGEL PI IND STRL 8.5 (GLOVE) ×1 IMPLANT
GLOVE BIOGEL PI INDICATOR 8.5 (GLOVE) ×1
GLOVE ECLIPSE 8.0 STRL XLNG CF (GLOVE) ×4 IMPLANT
GOWN PREVENTION PLUS XXLARGE (GOWN DISPOSABLE) ×2 IMPLANT
GOWN STRL REIN XL XLG (GOWN DISPOSABLE) ×4 IMPLANT
KIT ABG SYR 3ML LUER SLIP (SYRINGE) IMPLANT
NEEDLE HYPO 25X1 1.5 SAFETY (NEEDLE) ×2 IMPLANT
NEEDLE HYPO 25X5/8 SAFETYGLIDE (NEEDLE) IMPLANT
PACK C SECTION WH (CUSTOM PROCEDURE TRAY) ×2 IMPLANT
PAD OB MATERNITY 4.3X12.25 (PERSONAL CARE ITEMS) ×2 IMPLANT
RINGERS IRRIG 1000ML POUR BTL (IV SOLUTION) ×2 IMPLANT
STAPLER VISISTAT 35W (STAPLE) IMPLANT
SUT MNCRL AB 3-0 PS2 27 (SUTURE) IMPLANT
SUT PLAIN 0 NONE (SUTURE) IMPLANT
SUT SILK 3 0 FS 1X18 (SUTURE) IMPLANT
SUT VIC AB 0 CT1 27 (SUTURE) ×4
SUT VIC AB 0 CT1 27XBRD ANBCTR (SUTURE) ×2 IMPLANT
SUT VIC AB 2-0 CTX 36 (SUTURE) ×4 IMPLANT
SUT VIC AB 3-0 CT1 27 (SUTURE)
SUT VIC AB 3-0 CT1 TAPERPNT 27 (SUTURE) IMPLANT
SUT VIC AB 3-0 SH 27 (SUTURE)
SUT VIC AB 3-0 SH 27X BRD (SUTURE) IMPLANT
SYR CONTROL 10ML LL (SYRINGE) ×2 IMPLANT
TOWEL OR 17X24 6PK STRL BLUE (TOWEL DISPOSABLE) ×2 IMPLANT
TRAY FOLEY CATH 14FR (SET/KITS/TRAYS/PACK) ×2 IMPLANT
WATER STERILE IRR 1000ML POUR (IV SOLUTION) IMPLANT

## 2013-11-15 NOTE — Anesthesia Preprocedure Evaluation (Addendum)

## 2013-11-15 NOTE — Transfer of Care (Signed)
Immediate Anesthesia Transfer of Care Note  Patient: Kathy Hill  Procedure(s) Performed: Procedure(s): REPEAT CESAREAN SECTION (N/A)  Patient Location: PACU  Anesthesia Type:Spinal  Level of Consciousness: awake, alert  and oriented  Airway & Oxygen Therapy: Patient Spontanous Breathing  Post-op Assessment: Report given to PACU RN and Post -op Vital signs reviewed and stable  Post vital signs: stable  Complications: No apparent anesthesia complications

## 2013-11-15 NOTE — Op Note (Signed)
OPERATIVE NOTE  Patient's Name: Kathy Hill  Date of Birth: 05-14-1991   Medical Records Number: [id number]   Date of Operation: 11/15/2013   Preoperative diagnosis:  [redacted]w[redacted]d weeks gestation  Prior C/S  Desires Repeat Cesarean Section  Obesity  Anemia  Herpes virus  Postoperative diagnosis:  [redacted]w[redacted]d weeks gestation  Prior C/S  Desires Repeat Cesarean Section  Obesity  Anemia  Herpes virus  Procedure:  Repeat low transverse cesarean section  Surgeon:  Leonard Schwartz, M.D.  Assistant:  Nigel Bridgeman, certified nurse midwife  Anesthesia:  Spinal  Disposition:  Kathy Hill is a 22 y.o. female, [redacted]w[redacted]d, who presents at [redacted]w[redacted]d weeks gestation. The patient has been followed at the [facility] obstetrics and gynecology division of [facility] health care for women. This pregnancy has been complicated by a prior cesarean section. The patient desires a repeat cesarean section. She understands the indications for her procedure and she accepts the risk of, but not limited to, anesthetic complications, bleeding, infections, and possible damage to the surrounding organs.  Findings:  A  female [name]) was delivered from a OT position.  The Apgar scores were 5/7. The uterus, fallopian tubes, and ovaries were normal for the gravid state. Arterial cord blood pH=6.97. Venous cord blood pH=7.03.  Procedure:  The patient was taken to the operating room where a spinal anesthetic was given.The perineum was prepped with betadine. A Foley catheter was placed in the bladder.The patient's abdomen was prepped with Duraprep.   The patient was sterilely draped. A "timeout" was performed which properly identified the patient and the correct operative procedure. The lower abdomen was injected with half percent Marcaine with epinephrine. A low transverse incision was made in the abdomen and carried sharply through the subcutaneous tissue, the fascia, and the anterior  peritoneum. An incision was made in the lower uterine segment. The incision was extended in a low transverse fashion. The membranes were ruptured. The fetal head was delivered with a vacuum extractor because it was difficult to push the baby through the incision. The mouth and nose were suctioned. The remainder of the infant was then delivered. The cord was clamped and cut. The infant was handed to the awaiting pediatric team. The placenta was removed. The uterine cavity was cleaned of amniotic fluid, clotted blood, and membranes. The uterine incision was closed using a running locking suture of 2-0 Vicryl. An imbricating suture of 2-0 Vicryl was placed. The pelvis was vigorously irrigated. Hemostasis was adequate. The anterior peritoneum and the abdominal musculature were closed using 2-0 Vicryl. The fascia was closed using a running suture of 0 Vicryl followed by 3 interrupted sutures of 0 Vicryl. The subcutaneous layer was closed using interrupted sutures of 2-0 Vicryl. The skin was reapproximated using a subcuticular suture of 3-0 Monocryl. Sponge, needle, and instrument counts were correct on 2 occasions. The estimated blood loss for the procedure was 700 cc. The patient tolerated her procedure well. She was transported to the recovery room in stable condition. The infant remained in the operating room with the mother for skin to skin/bonding. The placenta was sent to pathology.  Leonard Schwartz, M.D.

## 2013-11-15 NOTE — Anesthesia Procedure Notes (Signed)
Spinal  Patient location during procedure: OR Preanesthetic Checklist Completed: patient identified, site marked, surgical consent, pre-op evaluation, timeout performed, IV checked, risks and benefits discussed and monitors and equipment checked Spinal Block Patient position: sitting Prep: DuraPrep Patient monitoring: heart rate, cardiac monitor, continuous pulse ox and blood pressure Approach: midline Location: L3-4 Injection technique: single-shot Needle Needle type: Sprotte  Needle gauge: 24 G Needle length: 9 cm Additional Notes Spinal Dosage in OR  Bupivicaine ml       1.3 PFMS04   mcg        150 Fentanyl mcg            25

## 2013-11-15 NOTE — Anesthesia Postprocedure Evaluation (Signed)
  Anesthesia Post-op Note  Patient: Kathy Hill  Procedure(s) Performed: Procedure(s): REPEAT CESAREAN SECTION (N/A)  Patient is awake, responsive, moving her legs, and has signs of resolution of her numbness. Pain and nausea are reasonably well controlled. Vital signs are stable and clinically acceptable. Oxygen saturation is clinically acceptable. There are no apparent anesthetic complications at this time. Patient is ready for discharge.

## 2013-11-15 NOTE — H&P (Signed)
The patient was interviewed and examined today.  The previously documented history and physical examination was reviewed. There are no changes. The operative procedure was reviewed. The risks and benefits were outlined again. The specific risks include, but are not limited to, anesthetic complications, bleeding, infections, and possible damage to the surrounding organs. The patient's questions were answered.  We are ready to proceed as outlined. The likelihood of the patient achieving the goals of this procedure is very likely.   Kathy Hill, M.D.  

## 2013-11-16 LAB — CBC
HCT: 30.2 % — ABNORMAL LOW (ref 36.0–46.0)
Hemoglobin: 9.8 g/dL — ABNORMAL LOW (ref 12.0–15.0)
MCH: 27.1 pg (ref 26.0–34.0)
MCV: 83.7 fL (ref 78.0–100.0)
RBC: 3.61 MIL/uL — ABNORMAL LOW (ref 3.87–5.11)

## 2013-11-16 NOTE — Progress Notes (Signed)
Subjective: Postpartum Day 1: Cesarean Delivery Patient reports incisional pain and tolerating PO.    Objective: Vital signs in last 24 hours: Temp:  [98 F (36.7 C)-99.2 F (37.3 C)] 99.2 F (37.3 C) (11/22 0830) Pulse Rate:  [72-106] 93 (11/22 0830) Resp:  [13-25] 20 (11/22 0830) BP: (98-125)/(61-83) 125/72 mmHg (11/22 0830) SpO2:  [95 %-100 %] 98 % (11/22 0830) Weight:  [240 lb (108.863 kg)] 240 lb (108.863 kg) (11/21 1918)  Physical Exam:  General: no distress Lochia: appropriate Uterine Fundus: firm Incision: healing well DVT Evaluation: No evidence of DVT seen on physical exam.   Recent Labs  11/14/13 0910 11/16/13 0632  HGB 12.0 9.8*  HCT 36.1 30.2*    Assessment/Plan: Status post Cesarean section. Doing well postoperatively.  Continue current care.  Kathy Hill V 11/16/2013, 12:09 PM

## 2013-11-16 NOTE — Anesthesia Postprocedure Evaluation (Signed)
  Anesthesia Post-op Note  Patient: Kathy Hill  Procedure(s) Performed: Procedure(s): REPEAT CESAREAN SECTION (N/A)  Patient Location: PACU and Mother/Baby  Anesthesia Type:Spinal  Level of Consciousness: awake, alert  and oriented  Airway and Oxygen Therapy: Patient Spontanous Breathing  Post-op Pain: mild  Post-op Assessment: Patient's Cardiovascular Status Stable, Respiratory Function Stable, No signs of Nausea or vomiting and Pain level controlled  Post-op Vital Signs: stable  Complications: No apparent anesthesia complications

## 2013-11-17 MED ORDER — POLYSACCHARIDE IRON COMPLEX 150 MG PO CAPS
150.0000 mg | ORAL_CAPSULE | Freq: Every day | ORAL | Status: DC
  Administered 2013-11-17 – 2013-11-18 (×2): 150 mg via ORAL
  Filled 2013-11-17 (×2): qty 1

## 2013-11-17 NOTE — Lactation Note (Addendum)
This note was copied from the chart of Kathy Hill. Lactation Consultation Note  Patient Name: Kathy Merdith Liese Today's Date: 11/17/2013 Reason for consult: Follow-up assessment;Breast/nipple pain;Difficult latch;Hyperbilirubinemia.  Mom has fed formula by bottle today (10-25 ml's) but has recently resumed latching on (L) breast but states she still has difficulty on (R).  Mom reports using lanolin sparingly when pumping to lubricate nipples and reports some relief of nipple soreness.  She has not obtained any expressed milk to feed baby today.  LC encouraged her to follow MD order for formula supplement until ebm available and continue offering (L) breast prior to supplement, pump (R) at each feeding for additional stimulation and call LC for feeding assistance, if needed.   Maternal Data Formula Feeding for Exclusion: Yes Reason for exclusion: Mother's choice to formula and breast feed on admission (MD has ordered supplement with formula after nursing due to jaundice and phototx)  Feeding Feeding Type: Bottle Fed - Formula  LATCH Score/Interventions           no LATCH score assessed today           Lactation Tools Discussed/Used Tools: Lanolin DEBP Cue feedings at breast, supplement per MD order (ebm as available) Night shift RN had also provided comfort gelpads for use between feedings  Consult Status Consult Status: Follow-up Date: 11/18/13 Follow-up type: In-patient    Warrick Parisian [facility] 11/17/2013, 5:47 PM

## 2013-11-17 NOTE — Progress Notes (Addendum)
Subjective: Postpartum Day 2: Repeat cesarean delivery. Patient up ad lib, reports no syncope or dizziness. Feeding:  Pumping, breastfeeding Contraceptive plan:  Nexplanon  Objective: Vital signs in last 24 hours: Temp:  [98.3 F (36.8 C)-99.2 F (37.3 C)] 98.5 F (36.9 C) (11/23 0708) Pulse Rate:  [86-100] 86 (11/23 0708) Resp:  [18-20] 18 (11/23 0708) BP: (109-125)/(60-74) 114/72 mmHg (11/23 0708) SpO2:  [98 %-100 %] 98 % (11/23 0708)  Physical Exam:  General: alert, cooperative and no distress Lochia: appropriate Uterine Fundus: firm Incision: healing well DVT Evaluation: No evidence of DVT seen on physical exam. Negative Homan's sign. JP drain:   n/a   Recent Labs  11/14/13 0910 11/16/13 0632  HGB 12.0 9.8*  HCT 36.1 30.2*    Assessment/Plan: Status post Cesarean section day 2. Asymptomatic anemia - Fe ordered Doing well postoperatively.  Continue current care. Discharge home today if baby is d/c'd, otherwise tomorrow    Haroldine Laws 11/17/2013, 8:28 AM

## 2013-11-18 LAB — TYPE AND SCREEN
ABO/RH(D): O POS
Antibody Screen: NEGATIVE
Unit division: 0

## 2013-11-18 MED ORDER — IBUPROFEN 600 MG PO TABS: 600.0000 mg | ORAL_TABLET | Freq: Four times a day (QID) | ORAL | Status: DC | PRN

## 2013-11-18 MED ORDER — OXYCODONE-ACETAMINOPHEN 5-325 MG PO TABS: 1.0000 | ORAL_TABLET | Freq: Four times a day (QID) | ORAL | Status: DC | PRN

## 2013-11-18 NOTE — Progress Notes (Signed)
UR chart review completed.  

## 2013-11-18 NOTE — Discharge Summary (Signed)
Obstetric Discharge Summary Reason for Admission: cesarean section Prenatal Procedures: ultrasound Intrapartum Procedures: Repeat Low Transverse C-Section Postpartum Procedures: none Complications-Operative and Postpartum: none Hemoglobin  Date Value Range Status  11/16/2013 9.8* 12.0 - 15.0 g/dL Final     DELTA CHECK NOTED     REPEATED TO VERIFY     HCT  Date Value Range Status  11/16/2013 30.2* 36.0 - 46.0 % Final   Baby under bili lights.  Pt being discharged but will room in.  She denies any problems except sore nipples from trying to breast feed.  She is tolerating po and ambulating without difficulty.  She plans to breast feed.  Plans nexplanon for [facility] and an out-patient circ.  Physical Exam:  General: alert and no distress Lochia: appropriate Uterine Fundus: firm Incision: dressing c/d/i DVT Evaluation: No evidence of DVT seen on physical exam.  Discharge Diagnoses: Term Pregnancy-delivered  Discharge Information: Date: 11/18/2013 Activity: pelvic rest Diet: routine Medications: Ibuprofen and Percocet Condition: stable Instructions: refer to practice specific booklet Discharge to: home and but pt plans to room in for now until the baby is discharged Follow-up Information   Follow up with [facility] In 6 weeks. (for post partum visit)    Specialty:  Obstetrics and Gynecology   Contact information:   [address] [place] [address] [phone]      Newborn Data: Live born female  Birth Weight: 8 lb 2 oz (3685 g) APGAR: 5, 7  Home with mother after adequate bili light therapy.  Haskell Rihn Y 11/18/2013, 10:04 AM

## 2013-11-19 ENCOUNTER — Encounter (HOSPITAL_COMMUNITY): Payer: Self-pay | Admitting: Obstetrics and Gynecology

## 2014-02-09 IMAGING — US US PELVIS COMPLETE
1 series · 13 of 25 positions shown · non-contrast
Comparison: 09/22/2010

CLINICAL DATA: Right-sided pelvic pain.  Negative pregnancy test.
LMP 11/06/2012.  Previous right salpingectomy.

TRANSABDOMINAL AND TRANSVAGINAL ULTRASOUND OF PELVIS
TECHNIQUE: Both transabdominal and transvaginal ultrasound
examinations of the pelvis were performed.  Transabdominal
technique was performed for global imaging of the pelvis including
uterus, ovaries, adnexal regions, and pelvic cul-de-sac.
It was necessary to proceed with endovaginal exam following the
transabdominal exam to visualize the .

[Series 1: us pelvis complete · 13 of 70 slices shown]
[im 1/70]
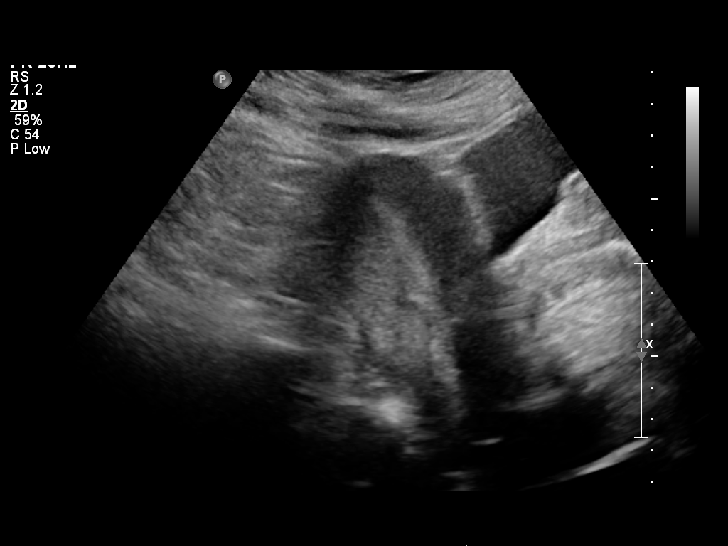
[im 6/70]
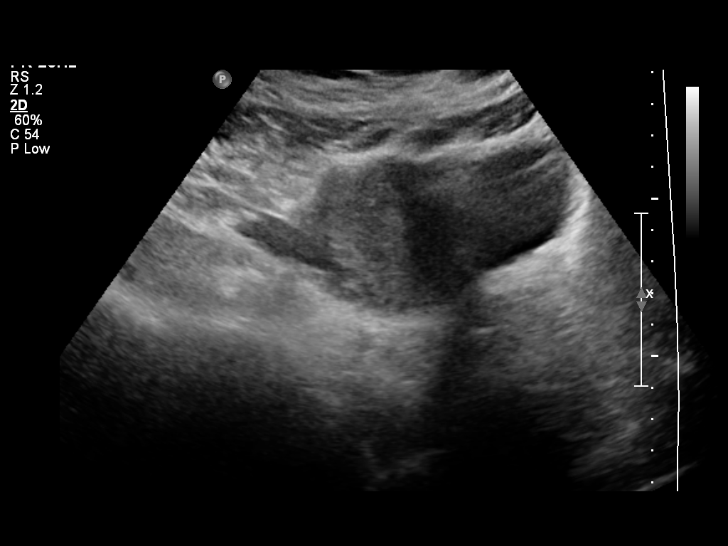
[im 12/70]
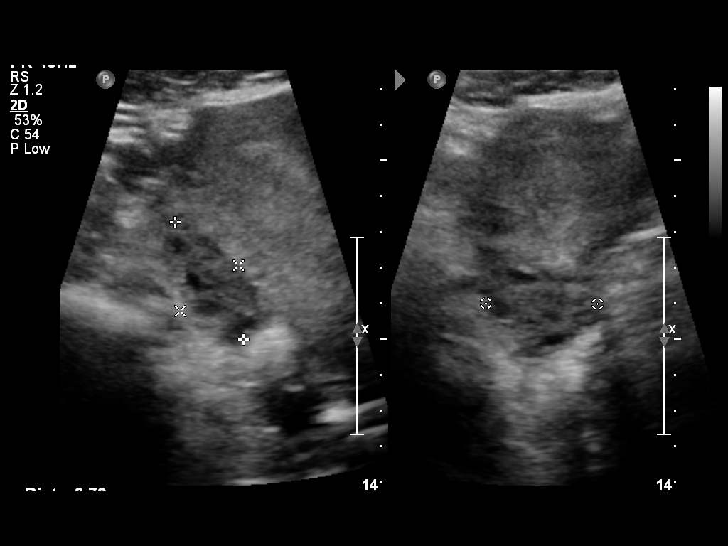
[im 18/70]
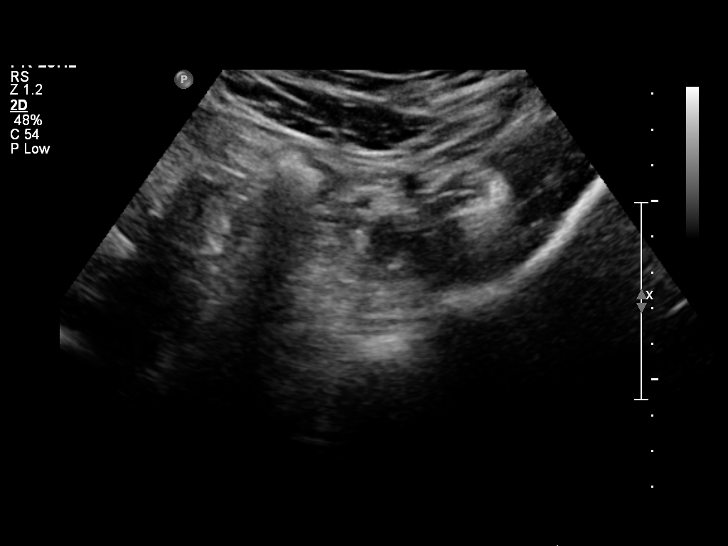
[im 24/70]
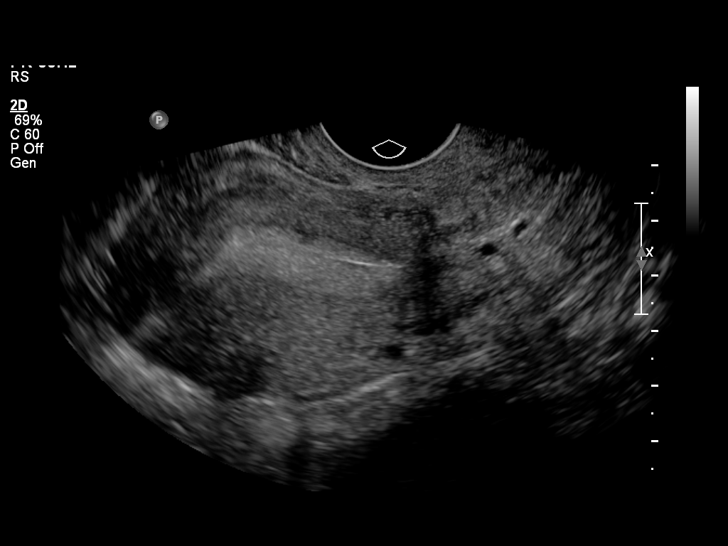
[im 29/70]
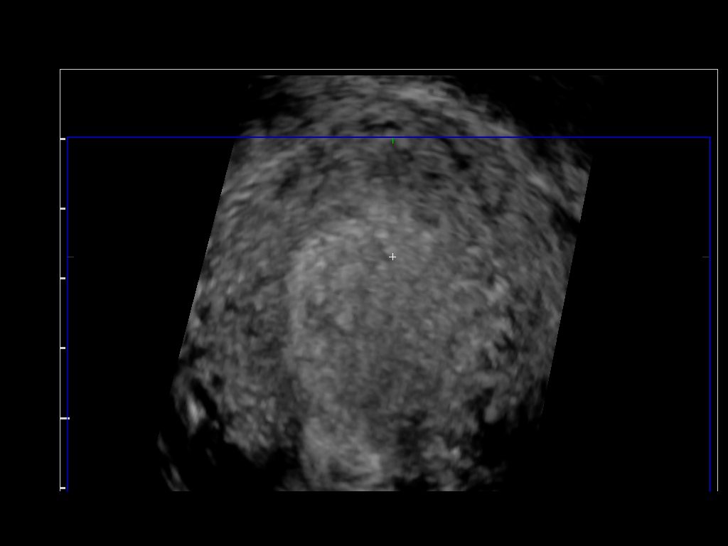
[im 35/70]
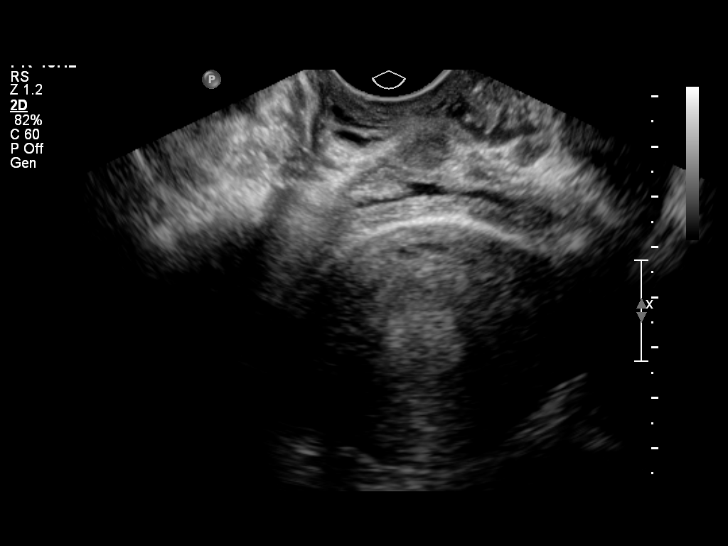
[im 41/70]
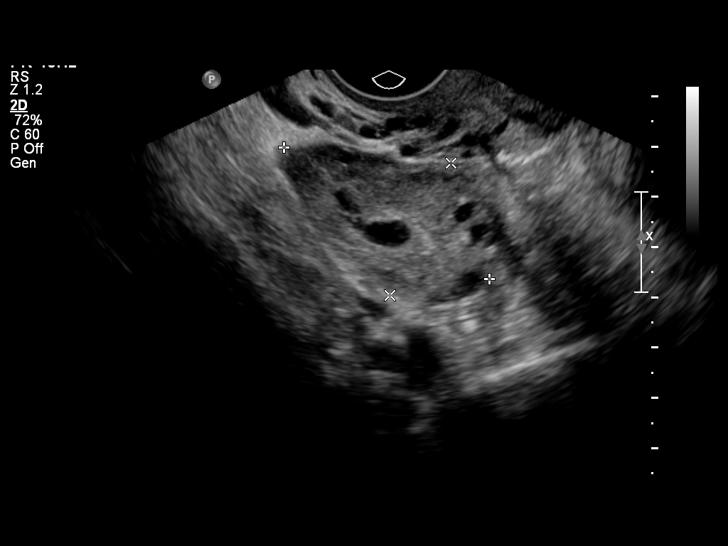
[im 47/70]
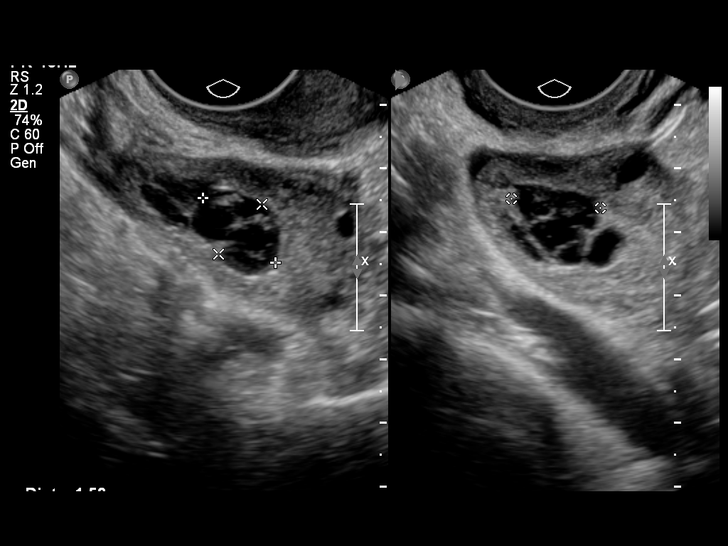
[im 52/70]
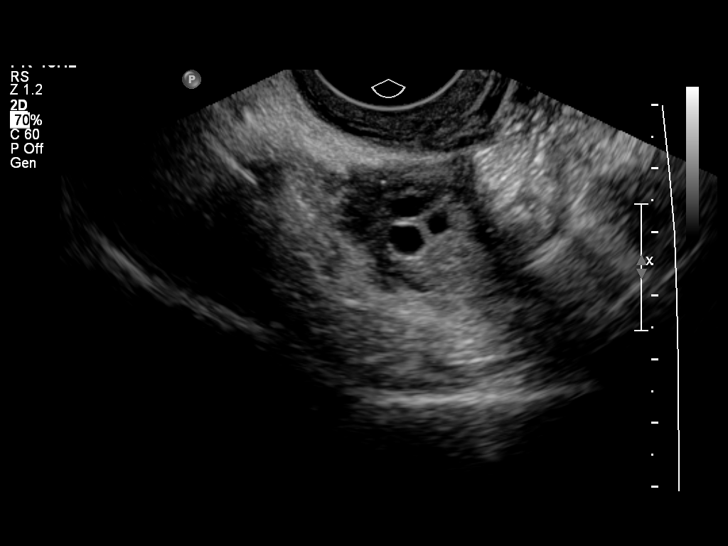
[im 58/70]
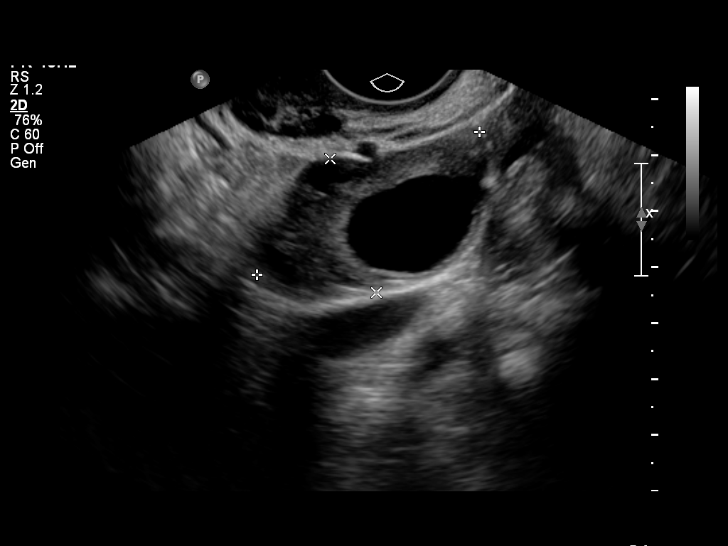
[im 64/70]
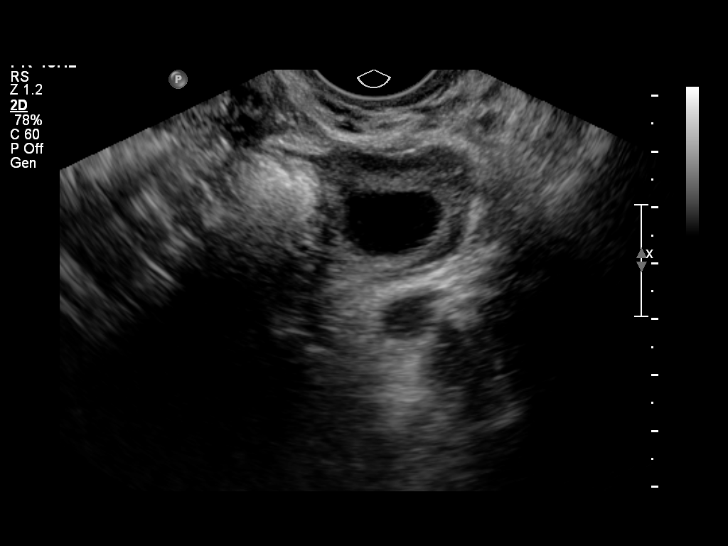
[im 70/70]
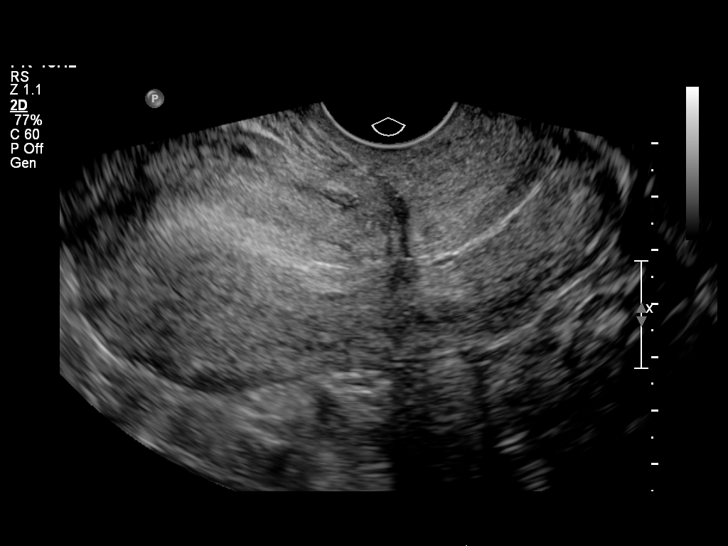

[13 of 25 positions shown; findings below may reference images not displayed]

FINDINGS: Uterus:  10.0 x 5.1 x 5.9 cm.  No fibroids or other uterine mass
identified.  Prior C-section scar incidentally noted.

Endometrium: Double layer thickness measures 9 mm transvaginally.
No focal lesion visualized.

Right ovary: 4.9 x 2.9 x 2.9 cm.  A small complex cyst is seen
containing multiple thin septations, which measures 1.5 cm.  No
evidence of blood flow within this lesion on color Doppler
ultrasound.

Left ovary: 4.7 x 2.5 x 2.7 cm.  Normal appearance.

Other Findings:  No free fluid
IMPRESSION: 1.  1.5 cm complex right ovariancyst, with indeterminate but
probably benign characteristics.  Recommend follow-up by
transvaginal ultrasound in 6-12 weeks. This recommendation follows
the consensus statement:  Management of Asymptomatic Ovarian and
Other Adnexal Cysts Imaged at US:  Society [employer] in
954.
2.  Normal appearance of uterus and left ovary.

## 2014-03-18 ENCOUNTER — Ambulatory Visit (INDEPENDENT_AMBULATORY_CARE_PROVIDER_SITE_OTHER): Payer: Managed Care, Other (non HMO) | Admitting: Nurse Practitioner

## 2014-03-18 ENCOUNTER — Encounter: Payer: Self-pay | Admitting: Nurse Practitioner

## 2014-03-18 VITALS — BP 110/78 | HR 64 | Ht 66.0 in | Wt 208.4 lb

## 2014-03-18 DIAGNOSIS — K602 Anal fissure, unspecified: Secondary | ICD-10-CM | POA: Insufficient documentation

## 2014-03-18 DIAGNOSIS — K625 Hemorrhage of anus and rectum: Secondary | ICD-10-CM

## 2014-03-18 MED ORDER — LIDOCAINE HCL 2 % EX GEL: 1.0000 "application " | CUTANEOUS | Status: DC | PRN

## 2014-03-18 MED ORDER — DILTIAZEM GEL 2 %: CUTANEOUS | Status: DC

## 2014-03-18 NOTE — Patient Instructions (Signed)
Avoid constipation , use stool softners.   [facility] [address]  Diltiazen Gel 2 and Xylocaine Jelly.   Come back to see Gunnar Fusiaula in about 3 weeks.  Call us sooner if your symptoms worsen.

## 2014-03-19 ENCOUNTER — Encounter: Payer: Self-pay | Admitting: Nurse Practitioner

## 2014-03-19 NOTE — Progress Notes (Signed)
Agree with initial assessment and plans. Clinical presentation and physical exam consistent with fissure.

## 2014-03-19 NOTE — Progress Notes (Signed)
HPI :  Patient is a 23 year old female referred by her gynecologist for valuation of rectal bleeding. She is approximately 4 months postpartum. During pregnancy patient developed hemorrhoids. Over the last month or so she's had rectal bleeding and pain with defecation. Takes stool softener, stools are relatively soft, does not strain. No abdominal pain. No other GI problems.   Past Medical History  Diagnosis Date  . Ectopic pregnancy   . Urinary tract infection     history only  . Eczema   . Ovarian cyst   . Genital herpes   . Infection     chlamydia history only  . Gonorrhea     history only  . Trichimoniasis     history only  . BV (bacterial vaginosis)     history only  . Obesity     Family History  Problem Relation Age of Onset  . Anesthesia problems Neg Hx   . Hypotension Neg Hx   . Malignant hyperthermia Neg Hx   . Pseudochol deficiency Neg Hx   . Other Neg Hx   . Thyroid disease Mother   . Kidney disease Father     on dylasis  . Hypertension Father   . Breast cancer Paternal Grandmother   . Diabetes Paternal Grandmother     type 2  . Colon polyps      pat side  . Heart disease Father    History  Substance Use Topics  . Smoking status: Never Smoker   . Smokeless tobacco: Never Used  . Alcohol Use: No   Current Outpatient Prescriptions  Medication Sig Dispense Refill  . ibuprofen (ADVIL,MOTRIN) 600 MG tablet Take 1 tablet (600 mg total) by mouth every 6 (six) hours as needed.  30 tablet  1  . Prenatal Vit-Fe Fumarate-FA (PRENATAL MULTIVITAMIN) TABS tablet Take 1 tablet by mouth at bedtime.      . valACYclovir (VALTREX) 500 MG tablet Take 500 mg by mouth daily as needed (with active breakouts).      . diltiazem 2 % GEL Use this gel rectally 3 times every day.  30 g  1  . lidocaine (XYLOCAINE) 2 % jelly Apply 1 application topically as needed. Use rectally 3 times daily as needed for rectal pain.  30 mL  1   No current facility-administered medications  for this visit.   No Known Allergies   Review of Systems: Positive for allergy/sinus trouble, back pain, vision changes, cough, headache, and night sweats . All other systems reviewed and negative except where noted in HPI.   Physical Exam: BP 110/78  Pulse 64  Ht 5\' 6"  (1.676 m)  Wt 208 lb 6.4 oz (94.53 kg)  BMI 33.65 kg/m2  LMP 03/04/2014  Breastfeeding? No Constitutional: Pleasant,well-developed, black female in no acute distress. HEENT: Normocephalic and atraumatic. Conjunctivae are normal. No scleral icterus. Neck supple.  Cardiovascular: Normal rate, regular rhythm.  Pulmonary/chest: Effort normal and breath sounds normal. No wheezing, rales or rhonchi. Abdominal: Soft, nondistended, nontender. Bowel sounds active throughout. There are no masses palpable. No hepatomegaly. Rectal: No external lesions. The patient had significant discomfort in posterior midline (just inside the anus) on DRE, did not perform an anoscopy. Extremities: no edema Lymphadenopathy: No cervical adenopathy noted. Neurological: Alert and oriented to person place and time. Skin: Skin is warm and dry. No rashes noted. Psychiatric: Normal mood and affect. Behavior is normal.   ASSESSMENT AND PLAN:  28. 23 year old female, approximately 4 months postpartum here with rectal pain and  bleeding with defecation. Given her symptoms and the significant discomfort on digital rectal exam I suspect she has a posterior midline anal fissure. Will treat with diltiazem gel 3 times a day. She can use Xylocaine gel 3 times daily as needed for pain in the rectal area and sitz baths prn. I discussed how to apply the diltiazem gel (just inside the anus in the posterior midline location). Patient will return to see me in approximately 3 weeks' time, she will call in the interim for worsening symptoms. Patient advised to avoid constipation and straining.

## 2014-04-14 ENCOUNTER — Ambulatory Visit: Payer: Managed Care, Other (non HMO) | Admitting: Nurse Practitioner

## 2014-04-19 ENCOUNTER — Encounter (HOSPITAL_COMMUNITY): Payer: Self-pay | Admitting: Emergency Medicine

## 2014-04-19 ENCOUNTER — Emergency Department (HOSPITAL_COMMUNITY)
Admission: EM | Admit: 2014-04-19 | Discharge: 2014-04-19 | Disposition: A | Discharge status: Home / Self Care | Payer: Managed Care, Other (non HMO) | Attending: Emergency Medicine | Admitting: Emergency Medicine

## 2014-04-19 DIAGNOSIS — Z8742 Personal history of other diseases of the female genital tract: Secondary | ICD-10-CM | POA: Diagnosis not present

## 2014-04-19 DIAGNOSIS — Z79899 Other long term (current) drug therapy: Secondary | ICD-10-CM | POA: Insufficient documentation

## 2014-04-19 DIAGNOSIS — Z872 Personal history of diseases of the skin and subcutaneous tissue: Secondary | ICD-10-CM | POA: Diagnosis not present

## 2014-04-19 DIAGNOSIS — M533 Sacrococcygeal disorders, not elsewhere classified: Secondary | ICD-10-CM | POA: Diagnosis not present

## 2014-04-19 DIAGNOSIS — Z8619 Personal history of other infectious and parasitic diseases: Secondary | ICD-10-CM | POA: Insufficient documentation

## 2014-04-19 DIAGNOSIS — M62830 Muscle spasm of back: Secondary | ICD-10-CM

## 2014-04-19 DIAGNOSIS — M5380 Other specified dorsopathies, site unspecified: Secondary | ICD-10-CM | POA: Diagnosis not present

## 2014-04-19 DIAGNOSIS — E669 Obesity, unspecified: Secondary | ICD-10-CM | POA: Diagnosis not present

## 2014-04-19 DIAGNOSIS — Z87440 Personal history of urinary (tract) infections: Secondary | ICD-10-CM | POA: Diagnosis not present

## 2014-04-19 DIAGNOSIS — M549 Dorsalgia, unspecified: Secondary | ICD-10-CM | POA: Diagnosis present

## 2014-04-19 MED ORDER — OXYCODONE-ACETAMINOPHEN 5-325 MG PO TABS: 1.0000 | ORAL_TABLET | ORAL | Status: DC | PRN

## 2014-04-19 MED ORDER — DEXAMETHASONE SODIUM PHOSPHATE 10 MG/ML IJ SOLN
10.0000 mg | Freq: Once | INTRAMUSCULAR | Status: AC
  Administered 2014-04-19: 10 mg via INTRAMUSCULAR
  Filled 2014-04-19: qty 1

## 2014-04-19 MED ORDER — KETOROLAC TROMETHAMINE 60 MG/2ML IM SOLN
60.0000 mg | Freq: Once | INTRAMUSCULAR | Status: AC
  Administered 2014-04-19: 60 mg via INTRAMUSCULAR
  Filled 2014-04-19: qty 2

## 2014-04-19 MED ORDER — NAPROXEN 500 MG PO TABS: 500.0000 mg | ORAL_TABLET | Freq: Two times a day (BID) | ORAL | Status: DC

## 2014-04-19 NOTE — ED Notes (Signed)
No allergic reaction noted. Pt comfortable with discharge.

## 2014-04-19 NOTE — ED Notes (Signed)
Pt comfortable with discharge and follow up instructions. Prescriptions x2. 

## 2014-04-19 NOTE — Discharge Instructions (Signed)
SEEK IMMEDIATE MEDICAL ATTENTION IF: New numbness, tingling, weakness, or problem with the use of your arms or legs.  Severe back pain not relieved with medications.  Change in bowel or bladder control.  Increasing pain in any areas of the body (such as chest or abdominal pain).  Shortness of breath, dizziness or fainting.  Nausea (feeling sick to your stomach), vomiting, fever, or sweats.    Sacroiliac Joint Dysfunction The sacroiliac joint connects the lower part of the spine (the sacrum) with the bones of the pelvis. CAUSES  Sometimes, there is no obvious reason for sacroiliac joint dysfunction. Other times, it may occur   During pregnancy.  After injury, such as:  Car accidents.  Sport-related injuries.  Work-related injuries.  Due to one leg being shorter than the other.  Due to other conditions that affect the joints, such as:  Rheumatoid arthritis.  Gout.  Psoriasis.  Joint infection (septic arthritis). SYMPTOMS  Symptoms may include:  Pain in the:  Lower back.  Buttocks.  Groin.  Thighs and legs.  Difficult sitting, standing, walking, lying, bending or lifting. DIAGNOSIS  A number of tests may be used to help diagnose the cause of sacroiliac joint dysfunction, including:  Imaging tests to look for other causes of pain, including:  MRI.  CT scan.  Bone scan.  Diagnostic injection: During a special x-ray (called fluoroscopy), a needle is put into the sacroiliac joint. A numbing medicine is injected into the joint. If the pain is improved or stopped, the diagnosis of sacroiliac joint dysfunction is more likely. TREATMENT  There are a number of types of treatment used for sacroiliac joint dysfunction, including:  Only take over-the-counter or prescription medicines for pain, discomfort, or fever as directed by your caregiver.  Medications to relax muscles.  Rest. Decreasing activity can help cut down on painful muscle spasms and allow the back  to heal.  Application of heat or ice to the lower back may improve muscle spasms and soothe pain.  Brace. A special back brace, called a sacroiliac belt, can help support the joint while your back is healing.  Physical therapy can help teach comfortable positions and exercises to strengthen muscles that support the sacroiliac joint.  Cortisone injections. Injections of steroid medicine into the joint can help decrease swelling and improve pain.  Hyaluronic acid injections. This chemical improves lubrication within the sacroiliac joint, thereby decreasing pain.  Radiofrequency ablation. A special needle is placed into the joint, where it burns away nerves that are carrying pain messages from the joint.  Surgery. Because pain occurs during movement of the joint, screws and plates may be installed in order to limit or prevent joint motion. HOME CARE INSTRUCTIONS   Take all medications exactly as directed.  Follow instructions regarding both rest and physical activity, to avoid worsening the pain.  Do physical therapy exercises exactly as prescribed. SEEK IMMEDIATE MEDICAL CARE IF:  You experience increasingly severe pain.  You develop new symptoms, such as numbness or tingling in your legs or feet.  You lose bladder or bowel control. Document Released: 03/10/2009 Document Revised: 03/05/2012 Document Reviewed: 03/10/2009 [facility] Patient Information 2014 [employer].

## 2014-04-19 NOTE — ED Notes (Signed)
ED PA at bedside

## 2014-04-19 NOTE — ED Provider Notes (Signed)
CSN: [id number]     Arrival date & time 04/19/14  1417 History  This chart was scribed for non-physician practitioner, Arthor CaptainAbigail Inetha Maret, PA-C, working with Rolland PorterMark James, MD by Shari HeritageAisha Amuda, ED Scribe. This patient was seen in room TR05C/TR05C and the patient's care was started at 3:25 PM.   Chief Complaint  Patient presents with  . Back Pain    The history is provided by the patient. No language interpreter was used.    HPI Comments: Kathy Hill is a 23 y.o. female who presents to the Emergency Department complaining of constant, non-radiating, moderate right and mid lower back pain for the past 2 months that worsened this morning upon waking. Patient states that she delivered a baby boy on 11/18/2013 and thought pain was resultant from an epidural, but it has not improved. Pain is worse with bending at the waist and when she sits up straight. Pain is improved when she twists and while lying down. She has been taking ibuprofen and Aleve without relief. There is no numbness or tingling. She denies bowel incontinence, bladder incontinence, nausea, vomiting, rashes, headaches or photophobia. Patient denies IV drug use. [id number].    Past Medical History  Diagnosis Date  . Ectopic pregnancy   . Urinary tract infection     history only  . Eczema   . Ovarian cyst   . Genital herpes   . Infection     chlamydia history only  . Gonorrhea     history only  . Trichimoniasis     history only  . BV (bacterial vaginosis)     history only  . Obesity    Past Surgical History  Procedure Laterality Date  . Cesarean section  2011    [facility]  . Laparoscopy  02/18/2012    Procedure: LAPAROSCOPY OPERATIVE;  Surgeon: Catalina AntiguaPeggy Constant, MD;  Location: [facility];  Service: Gynecology;  Laterality: N/A;  Lysis of Adhesions, Operative laparoscopy for ectopic pregnancy, left salpingectomy,   . Therapeutic abortion    . Unilateral salpingectomy      right  . Cesarean section N/A 11/15/2013    Procedure: REPEAT  CESAREAN SECTION;  Surgeon: Kirkland HunArthur Stringer, MD;  Location: [facility] ORS;  Service: Obstetrics;  Laterality: N/A;   Family History  Problem Relation Age of Onset  . Anesthesia problems Neg Hx   . Hypotension Neg Hx   . Malignant hyperthermia Neg Hx   . Pseudochol deficiency Neg Hx   . Other Neg Hx   . Thyroid disease Mother   . Kidney disease Father     on dylasis  . Hypertension Father   . Breast cancer Paternal Grandmother   . Diabetes Paternal Grandmother     type 2  . Colon polyps      pat side  . Heart disease Father    History  Substance Use Topics  . Smoking status: Never Smoker   . Smokeless tobacco: Never Used  . Alcohol Use: No   OB History   Grav Para Term Preterm Abortions TAB SAB Ect Mult Living   4 2 2  0 2 1 0 1 0 2     Review of Systems  Constitutional: Negative for fever and chills.  Eyes: Negative for photophobia.  Gastrointestinal: Negative for nausea and vomiting.       Negative for bowel incontinence.   Genitourinary: Negative for dysuria, frequency and hematuria.       Negative for bladder incontinence.   Musculoskeletal: Positive for back pain.  Skin:  Negative for rash.  Neurological: Negative for weakness, numbness and headaches.    Allergies  Review of patient's allergies indicates no known allergies.  Home Medications   Prior to Admission medications   Medication Sig Start Date End Date Taking? Authorizing Provider  diltiazem 2 % GEL Use this gel rectally 3 times every day. 03/18/14   Meredith PelPaula M Guenther, NP  ibuprofen (ADVIL,MOTRIN) 600 MG tablet Take 1 tablet (600 mg total) by mouth every 6 (six) hours as needed. 11/18/13   Purcell NailsAngela Y Roberts, MD  lidocaine (XYLOCAINE) 2 % jelly Apply 1 application topically as needed. Use rectally 3 times daily as needed for rectal pain. 03/18/14   Meredith PelPaula M Guenther, NP  Prenatal Vit-Fe Fumarate-FA (PRENATAL MULTIVITAMIN) TABS tablet Take 1 tablet by mouth at bedtime.    Historical Provider, MD  valACYclovir  (VALTREX) 500 MG tablet Take 500 mg by mouth daily as needed (with active breakouts). 01/09/13   Allie BossierMyra C Dove, MD   Triage Vitals: BP 121/74  Pulse 98  Temp(Src) 97.9 F (36.6 C) (Oral)  Resp 18  Wt 200 lb 14.4 oz (91.128 kg)  SpO2 99% Physical Exam  Constitutional: She is oriented to person, place, and time. She appears well-developed and well-nourished. No distress.  HENT:  Head: Normocephalic and atraumatic.  Eyes: Conjunctivae and EOM are normal.  Neck: Neck supple.  Cardiovascular: Normal rate.   Pulmonary/Chest: Effort normal.  Musculoskeletal: She exhibits tenderness.  Tender to palpation over the right sacrum and bilateral SI joints, worse on the right and into the lower right lumbar paraspinals.  Neurological: She is alert and oriented to person, place, and time.  Skin: Skin is warm and dry.  Psychiatric: She has a normal mood and affect. Her behavior is normal.    ED Course  Procedures (including critical care time) DIAGNOSTIC STUDIES: Oxygen Saturation is 99% on room air, normal by my interpretation.    COORDINATION OF CARE: 3:31 PM- Patient informed of current plan for treatment and evaluation and agrees with plan at this time.    MDM   Final diagnoses:  Back muscle spasm  Sacroiliac joint dysfunction of right side    Patient presents with back consistent with SI dysfunction after pregnancy. No neurological deficits and normal neuro exam. Patient can walk but states is painful. No loss of bowel or bladder control.  No concern for cauda equina. No fever, night sweats, weight loss, h/o cancer, IVDU. Will treat with with Decadron and Toradol in the ED and discharge home with naproxen and Norco. Advised patient to follow up with ortho if pain does not improve.   I personally performed the services described in this documentation, which was scribed in my presence. The recorded information has been reviewed and considered.  Arthor Captainbigail Shaquan Puerta, PA-C 04/20/14 1919

## 2014-04-19 NOTE — ED Notes (Signed)
Pt. Stated, I 've had lower back pain for 2 months.  No injury and las night it got worse and this morning and can't even bend over to pick up my 675 month old son.

## 2014-04-24 NOTE — ED Provider Notes (Signed)
Medical screening examination/treatment/procedure(s) were performed by non-physician practitioner and as supervising physician I was immediately available for consultation/collaboration.   EKG Interpretation None        Rolland PorterMark Kevonta Phariss, MD 04/24/14 1655

## 2014-05-12 IMAGING — US US OB COMP LESS 14 WK
1 series · 13 of 28 positions shown · non-contrast
Comparison: none

[Series 1: us ob comp less 14 wks · 63 acquisitions, 13 frames shown]
[im 3/63]
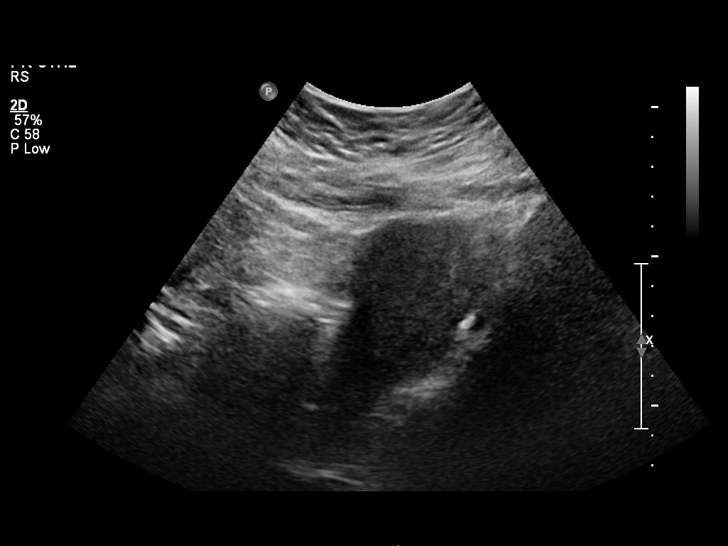
[im 7/63]
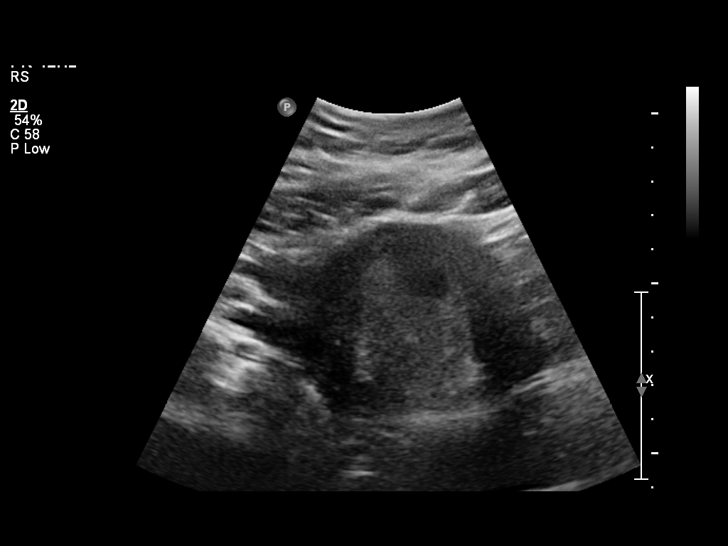
[im 12/63]
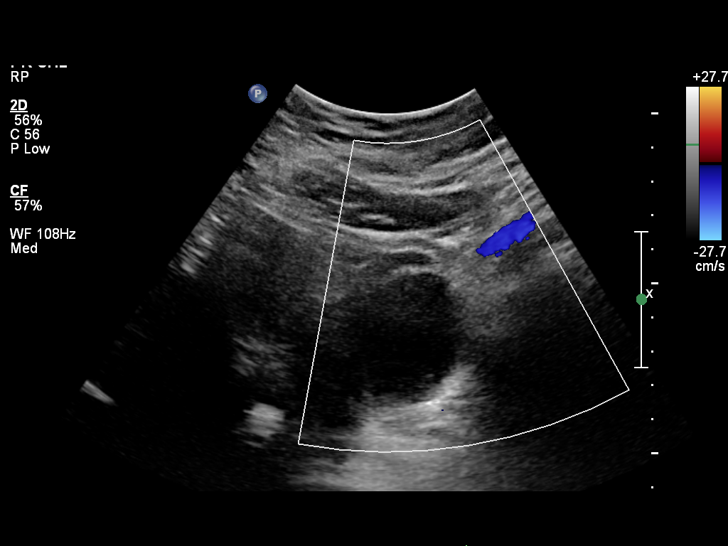
[im 17/63]
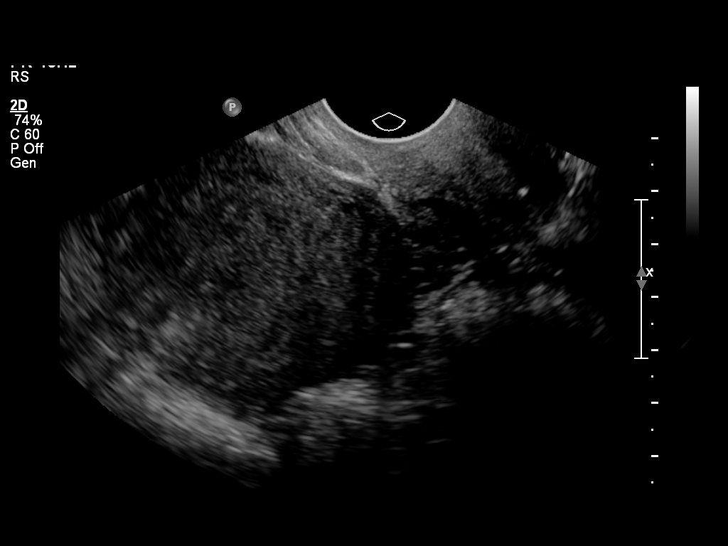
[im 21/63]
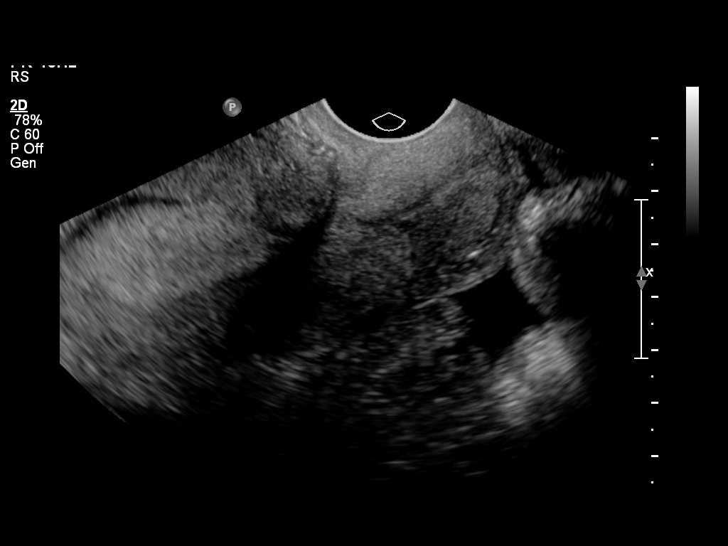
[im 26/63]
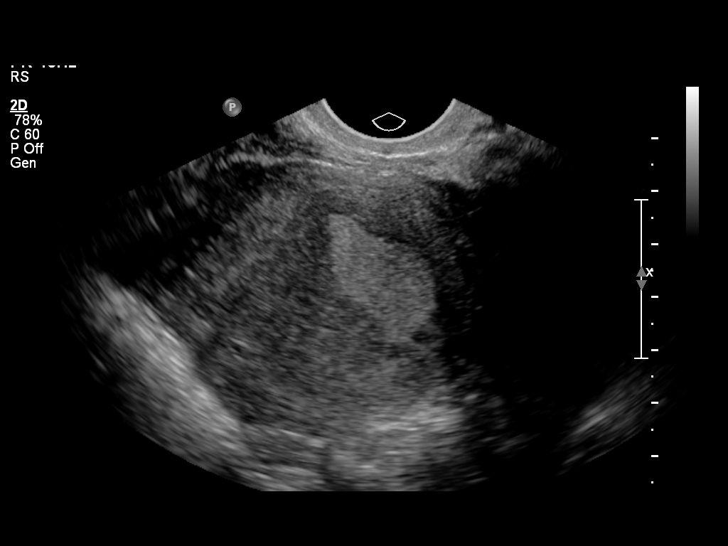
[im 33/63]
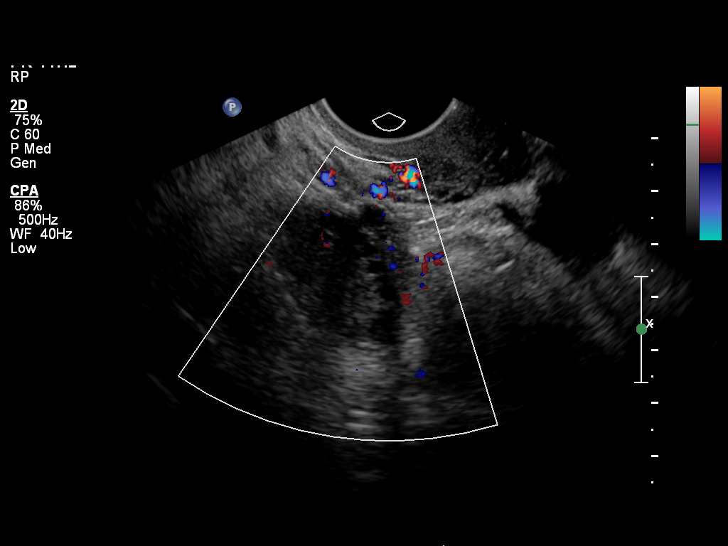
[im 37/63]
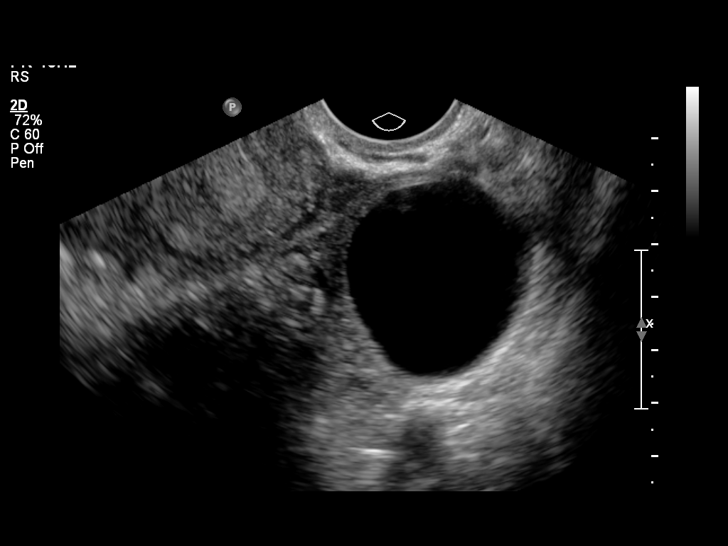
[im 42/63]
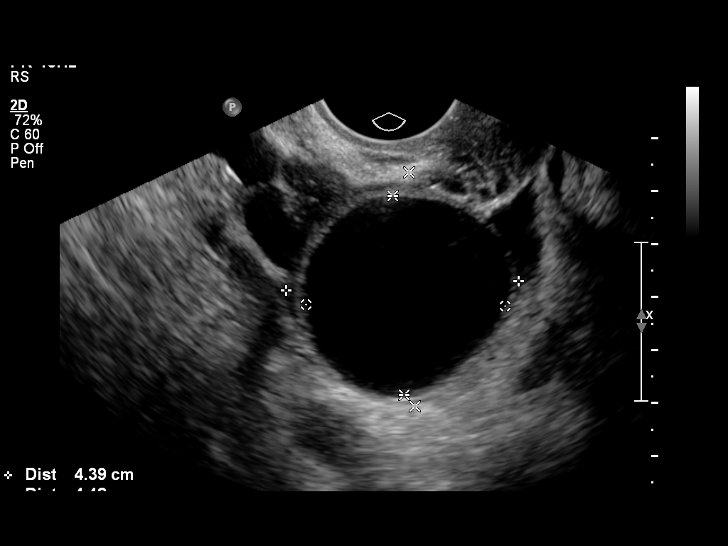
[im 46/63]
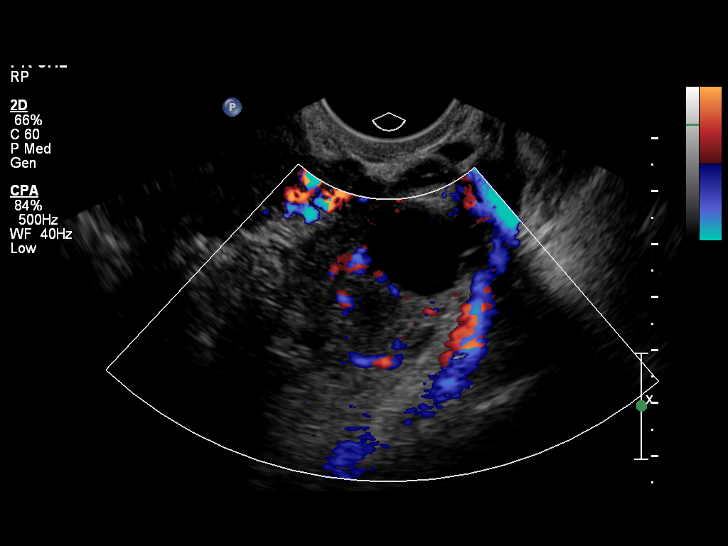
[im 51/63]
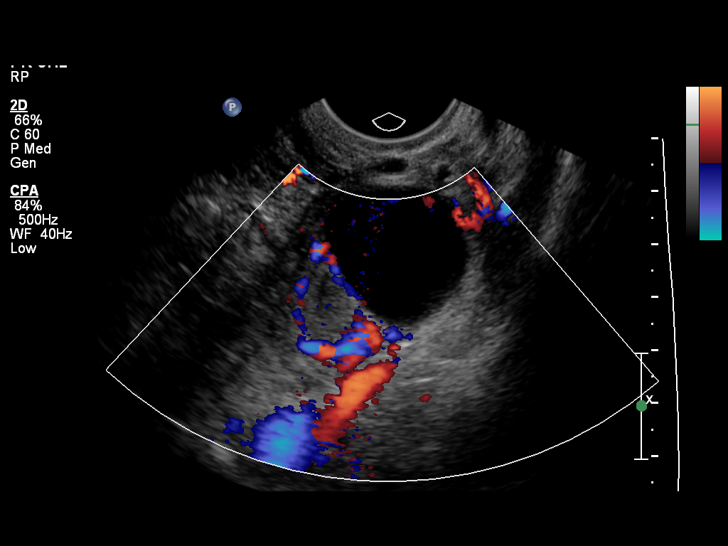
[im 56/63]
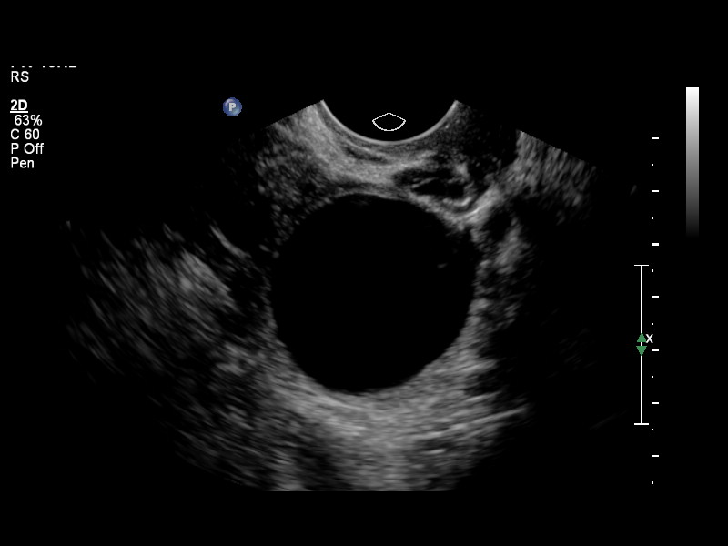
[im 60/63]
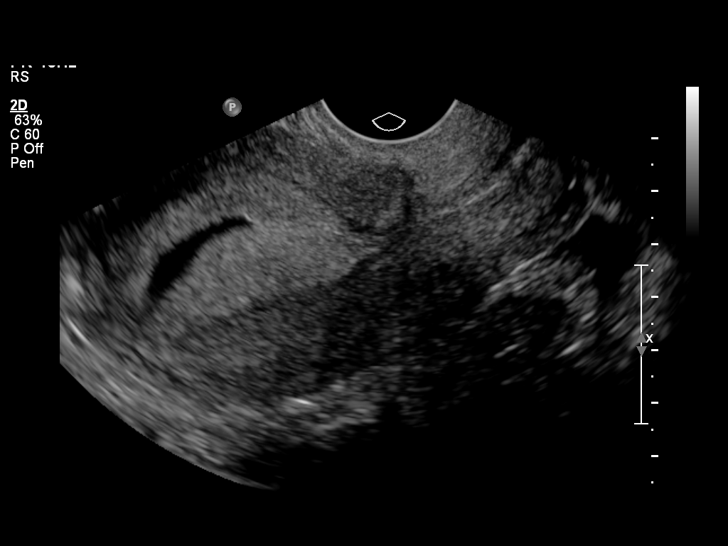

[13 of 28 positions shown; findings below may reference images not displayed]

OBSTETRICS REPORT
                      (Signed Final 03/11/2013 [DATE])

Service(s) Provided

 US OB COMP LESS 14 WKS                                76801.0
 US OB TRANSVAGINAL                                    76817.0
Indications

 Pregnancy with inconclusive fetal viability
 Poor obstetrical history (prior rt ectopic, rt
 salpingectomy)
 Pain - Abdominal/Pelvic
Fetal Evaluation

 Num Of Fetuses:    1
 Gest. Sac:         None seen
 Yolk Sac:          Not visualized
 Fetal Pole:        Not visualized
 Cardiac Activity:  No embryo visualized
Cervix Uterus Adnexa

 Cervix:       Closed
 Uterus:       Fluid visualized within the endometrium. The
               endometrium measure approx 2.3 cm double wall
               thickness.
 Cul De Sac:   Trace amount of free fluid seen.

 Left Ovary:   Small corpus luteum noted. Simple cyst measuring
               3.8 x1.5x 3.1 cm
 Right Ovary:  Within normal limits.
 Adnexa:     No abnormality visualized.
Impression

 No intrauterine pregnancy or suspicious adnexal mass is
 identified. (quantitative b-hcg today is 208).  DDx includes
 IUP too early to visualize, occult ectopic pregnancy, or
 spontaneous abortion.  Recommend close f/u of quantitative
 BHCG levels, and followup US as clinically warranted.
 Left oavarian simple cyst measures 3.8 x 3.8 x 3.1 cm.
 Small corpus luteum noted in left ovary.
 Fluid in the endometrial cavity.

## 2014-05-29 ENCOUNTER — Encounter (HOSPITAL_COMMUNITY): Payer: Self-pay | Admitting: Emergency Medicine

## 2014-05-29 ENCOUNTER — Emergency Department (HOSPITAL_COMMUNITY)
Admission: EM | Admit: 2014-05-29 | Discharge: 2014-05-29 | Disposition: A | Discharge status: Home / Self Care | Payer: Managed Care, Other (non HMO) | Attending: Emergency Medicine | Admitting: Emergency Medicine

## 2014-05-29 DIAGNOSIS — Z872 Personal history of diseases of the skin and subcutaneous tissue: Secondary | ICD-10-CM | POA: Diagnosis not present

## 2014-05-29 DIAGNOSIS — E669 Obesity, unspecified: Secondary | ICD-10-CM | POA: Diagnosis not present

## 2014-05-29 DIAGNOSIS — Z8619 Personal history of other infectious and parasitic diseases: Secondary | ICD-10-CM | POA: Insufficient documentation

## 2014-05-29 DIAGNOSIS — Z791 Long term (current) use of non-steroidal anti-inflammatories (NSAID): Secondary | ICD-10-CM | POA: Insufficient documentation

## 2014-05-29 DIAGNOSIS — S8990XA Unspecified injury of unspecified lower leg, initial encounter: Secondary | ICD-10-CM | POA: Diagnosis not present

## 2014-05-29 DIAGNOSIS — Z87440 Personal history of urinary (tract) infections: Secondary | ICD-10-CM | POA: Diagnosis not present

## 2014-05-29 DIAGNOSIS — Y92410 Unspecified street and highway as the place of occurrence of the external cause: Secondary | ICD-10-CM | POA: Diagnosis not present

## 2014-05-29 DIAGNOSIS — Y9389 Activity, other specified: Secondary | ICD-10-CM | POA: Insufficient documentation

## 2014-05-29 DIAGNOSIS — S99929A Unspecified injury of unspecified foot, initial encounter: Secondary | ICD-10-CM

## 2014-05-29 DIAGNOSIS — Z8742 Personal history of other diseases of the female genital tract: Secondary | ICD-10-CM | POA: Diagnosis not present

## 2014-05-29 DIAGNOSIS — S99919A Unspecified injury of unspecified ankle, initial encounter: Secondary | ICD-10-CM

## 2014-05-29 DIAGNOSIS — S0990XA Unspecified injury of head, initial encounter: Secondary | ICD-10-CM | POA: Insufficient documentation

## 2014-05-29 DIAGNOSIS — IMO0002 Reserved for concepts with insufficient information to code with codable children: Secondary | ICD-10-CM | POA: Diagnosis not present

## 2014-05-29 MED ORDER — NAPROXEN 500 MG PO TABS: 500.0000 mg | ORAL_TABLET | Freq: Two times a day (BID) | ORAL | Status: DC

## 2014-05-29 MED ORDER — METHOCARBAMOL 500 MG PO TABS: 500.0000 mg | ORAL_TABLET | Freq: Two times a day (BID) | ORAL | Status: DC

## 2014-05-29 NOTE — ED Provider Notes (Signed)
Medical screening examination/treatment/procedure(s) were performed by non-physician practitioner and as supervising physician I was immediately available for consultation/collaboration.   EKG Interpretation None        Sherronda Sweigert N Camryn Lampson, DO 05/29/14 1537 
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
## 2014-10-27 ENCOUNTER — Encounter (HOSPITAL_COMMUNITY): Payer: Self-pay | Admitting: Emergency Medicine

## 2015-09-13 ENCOUNTER — Encounter (HOSPITAL_COMMUNITY): Payer: Self-pay | Admitting: Emergency Medicine

## 2015-09-13 ENCOUNTER — Emergency Department (HOSPITAL_COMMUNITY)
Admission: EM | Admit: 2015-09-13 | Discharge: 2015-09-13 | Disposition: A | Discharge status: Home / Self Care | Payer: Managed Care, Other (non HMO) | Attending: Emergency Medicine | Admitting: Emergency Medicine

## 2015-09-13 DIAGNOSIS — E669 Obesity, unspecified: Secondary | ICD-10-CM | POA: Insufficient documentation

## 2015-09-13 DIAGNOSIS — S0993XA Unspecified injury of face, initial encounter: Secondary | ICD-10-CM | POA: Insufficient documentation

## 2015-09-13 DIAGNOSIS — Z87440 Personal history of urinary (tract) infections: Secondary | ICD-10-CM | POA: Insufficient documentation

## 2015-09-13 DIAGNOSIS — Y9289 Other specified places as the place of occurrence of the external cause: Secondary | ICD-10-CM | POA: Insufficient documentation

## 2015-09-13 DIAGNOSIS — Z8619 Personal history of other infectious and parasitic diseases: Secondary | ICD-10-CM | POA: Insufficient documentation

## 2015-09-13 DIAGNOSIS — S299XXA Unspecified injury of thorax, initial encounter: Secondary | ICD-10-CM | POA: Insufficient documentation

## 2015-09-13 DIAGNOSIS — Z79899 Other long term (current) drug therapy: Secondary | ICD-10-CM | POA: Insufficient documentation

## 2015-09-13 DIAGNOSIS — Z8742 Personal history of other diseases of the female genital tract: Secondary | ICD-10-CM | POA: Insufficient documentation

## 2015-09-13 DIAGNOSIS — Y998 Other external cause status: Secondary | ICD-10-CM | POA: Insufficient documentation

## 2015-09-13 DIAGNOSIS — Z872 Personal history of diseases of the skin and subcutaneous tissue: Secondary | ICD-10-CM | POA: Insufficient documentation

## 2015-09-13 DIAGNOSIS — Y9389 Activity, other specified: Secondary | ICD-10-CM | POA: Insufficient documentation

## 2015-09-13 MED ORDER — CEFTRIAXONE SODIUM 250 MG IJ SOLR
250.0000 mg | Freq: Once | INTRAMUSCULAR | Status: DC
  Filled 2015-09-13: qty 250

## 2015-09-13 MED ORDER — LIDOCAINE HCL (PF) 1 % IJ SOLN
INTRAMUSCULAR | Status: AC
  Filled 2015-09-13: qty 5

## 2015-09-13 MED ORDER — IBUPROFEN 800 MG PO TABS
800.0000 mg | ORAL_TABLET | Freq: Once | ORAL | Status: AC
  Administered 2015-09-13: 800 mg via ORAL
  Filled 2015-09-13: qty 1

## 2015-09-13 MED ORDER — AZITHROMYCIN 250 MG PO TABS
1000.0000 mg | ORAL_TABLET | Freq: Once | ORAL | Status: DC
  Filled 2015-09-13: qty 4

## 2015-09-13 MED ORDER — IBUPROFEN 800 MG PO TABS: 800.0000 mg | ORAL_TABLET | Freq: Three times a day (TID) | ORAL | Status: DC
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
## 2015-09-13 NOTE — Discharge Instructions (Signed)
Please obtain all of your results from medical records or have your doctors office obtain the results - share them with your doctor - you should be seen at your doctors office in the next 2 days. Call today to arrange your follow up. Take the medications as prescribed. Please review all of the medicines and only take them if you do not have an allergy to them. Please be aware that if you are taking birth control pills, taking other prescriptions, ESPECIALLY ANTIBIOTICS may make the birth control ineffective - if this is the case, either do not engage in sexual activity or use alternative methods of birth control such as condoms until you have finished the medicine and your family doctor says it is OK to restart them. If you are on a blood thinner such as COUMADIN, be aware that any other medicine that you take may cause the coumadin to either work too much, or not enough - you should have your coumadin level rechecked in next 7 days if this is the case.  °?  °It is also a possibility that you have an allergic reaction to any of the medicines that you have been prescribed - Everybody reacts differently to medications and while MOST people have no trouble with most medicines, you may have a reaction such as nausea, vomiting, rash, swelling, shortness of breath. If this is the case, please stop taking the medicine immediately and contact your physician.  °?  °You should return to the ER if you develop severe or worsening symptoms.  ° °Pelvic Infection ° °If you have been diagnosed with a pelvic infection such as a sexually transmitted disease, you will need to be treated with antibiotics. Please take the medicines as prescribed. Some of these tests do not come back for 1-2 days in which case if they turn positive you will receive a phone call to let you know. If you are contacted and do have an infection consistent with a sexually transmitted disease, then you will need to tell any and all sexual partners that you have  had in the last 6 months no so that they can be tested and treated as well. If you should develop severe or worsening pain in your abdomen or the pelvis or develop severe fevers,nausea or vomiting that prevent you from taking your medications, return to the emergency department immediately. Otherwise contact your local physician or county health department for a follow up appointment to complete STD testing including HIV and syphilis.  See the list of phone numbers below. ° °RESOURCE GUIDE ° °Dental Problems ° °Patients with Medicaid: °[facility]                     [facility] °[address]                                           [address] °Phone:  [phone]                                                  Phone:  510-2600 ° °If unable to pay or uninsured, contact:  [facility] or [facility] to become qualified for the adult dental clinic. ° °  Chronic Pain Problems °Contact [facility]  [phone] °Patients need to be referred by their primary care doctor. ° °Insufficient Money for Medicine °Contact [employer]:  call "211" or [facility] 271-5999. ° °No Primary Care Doctor °Call [facility]  832-8000 °Other agencies that provide inexpensive medical care °   [facility]  [phone] °   [facility]  832-7272 °   [facility]  [phone] °   [facility]  832-4777 °   [facility]  [phone] °   [facility]  [phone] ° °Psychological Services °[facility]  [phone] °[facility]  [phone] °[facility]   [phone] (emergency services 641-4993) ° °Substance Abuse Resources °[facility]  336-882-2125 °[facility] 336-784-9470 °[facility] 336-285-9073 °[facility] 336-845-3988 °Residential & Outpatient Substance Abuse Program  800-659-3381 ° °Abuse/Neglect °[place] [facility] (336)  641-3795 °[place] [facility] Hot[facility] 800-378-5315 (After Hours) ° °Emergency Shelter °[facility] (336) 271-5985 ° °Maternity Homes °[facility] (336) 275-9566 °[facility] (704) 372-4663 ° °MRSA Hotline #:   832-7006 ° ° ° °[place] [facility] ° °[facility] [facility]     [employer]                          [facility] °[address]                       [address]      [address]  °[place]                                                [place]                            [place] °Phone:  349-3220                                   Phone:  [phone]                 Phone:  342-8140 ° °[facility] °Phone:  342-8316 ° °[place] [facility] Hotline °(336) 342-1394 °(336) 342-3537 (After Hours) ° ° °
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
## 2015-09-13 NOTE — ED Notes (Signed)
Went to give pt ordered meds, she is not in the room, I will notify EDP of the possible elopement.

## 2015-09-14 ENCOUNTER — Encounter (HOSPITAL_COMMUNITY): Payer: Self-pay | Admitting: Emergency Medicine

## 2015-09-14 ENCOUNTER — Emergency Department (HOSPITAL_COMMUNITY)
Admission: EM | Admit: 2015-09-14 | Discharge: 2015-09-15 | Discharge status: Against Medical Advice | Payer: Managed Care, Other (non HMO) | Attending: Emergency Medicine | Admitting: Emergency Medicine

## 2015-09-14 ENCOUNTER — Emergency Department (HOSPITAL_COMMUNITY)
Admission: EM | Admit: 2015-09-14 | Discharge: 2015-09-14 | Disposition: A | Discharge status: Other Acute Inpatient Hospital | Payer: Managed Care, Other (non HMO)

## 2015-09-14 DIAGNOSIS — S098XXA Other specified injuries of head, initial encounter: Secondary | ICD-10-CM | POA: Insufficient documentation

## 2015-09-14 DIAGNOSIS — E669 Obesity, unspecified: Secondary | ICD-10-CM | POA: Insufficient documentation

## 2015-09-14 DIAGNOSIS — Y939 Activity, unspecified: Secondary | ICD-10-CM | POA: Insufficient documentation

## 2015-09-14 DIAGNOSIS — N898 Other specified noninflammatory disorders of vagina: Secondary | ICD-10-CM | POA: Insufficient documentation

## 2015-09-14 DIAGNOSIS — S299XXA Unspecified injury of thorax, initial encounter: Secondary | ICD-10-CM | POA: Insufficient documentation

## 2015-09-14 DIAGNOSIS — Y999 Unspecified external cause status: Secondary | ICD-10-CM | POA: Insufficient documentation

## 2015-09-14 DIAGNOSIS — Y929 Unspecified place or not applicable: Secondary | ICD-10-CM | POA: Insufficient documentation

## 2015-09-14 NOTE — ED Notes (Signed)
Pt st's she is tired and doesn't want to stay any longer
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
## 2015-09-14 NOTE — ED Notes (Signed)
Attempted to call pt to triage room, however no answer from the lobby.

## 2016-01-04 ENCOUNTER — Encounter (HOSPITAL_COMMUNITY): Payer: Self-pay

## 2016-01-04 ENCOUNTER — Inpatient Hospital Stay (HOSPITAL_COMMUNITY)
Admission: AD | Admit: 2016-01-04 | Discharge: 2016-01-04 | Discharge status: Against Medical Advice | Payer: Managed Care, Other (non HMO) | Source: Ambulatory Visit | Attending: Obstetrics & Gynecology | Admitting: Obstetrics & Gynecology

## 2016-01-04 DIAGNOSIS — Z5320 Procedure and treatment not carried out because of patient's decision for unspecified reasons: Secondary | ICD-10-CM | POA: Insufficient documentation

## 2016-01-04 DIAGNOSIS — Z113 Encounter for screening for infections with a predominantly sexual mode of transmission: Secondary | ICD-10-CM | POA: Insufficient documentation

## 2016-01-04 LAB — URINE MICROSCOPIC-ADD ON

## 2016-01-04 LAB — URINALYSIS, ROUTINE W REFLEX MICROSCOPIC
BILIRUBIN URINE: NEGATIVE
GLUCOSE, UA: NEGATIVE mg/dL
KETONES UR: NEGATIVE mg/dL
LEUKOCYTES UA: NEGATIVE
NITRITE: NEGATIVE
PROTEIN: NEGATIVE mg/dL
Specific Gravity, Urine: >1.03 — ABNORMAL HIGH (ref 1.005–1.030)
pH: 5.5 (ref 5.0–8.0)

## 2016-01-04 LAB — POCT PREGNANCY, URINE: Preg Test, Ur: NEGATIVE

## 2016-01-04 LAB — PREGNANCY, URINE: Preg Test, Ur: NEGATIVE
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
## 2016-01-04 NOTE — MAU Note (Signed)
Pt came out of room, stating "I have to go now. I'll be back later tonight, but I have to go get the kids settled."

## 2016-01-15 ENCOUNTER — Inpatient Hospital Stay (HOSPITAL_COMMUNITY)
Admission: AD | Admit: 2016-01-15 | Discharge: 2016-01-15 | Disposition: A | Discharge status: Home / Self Care | Payer: BLUE CROSS/BLUE SHIELD | Source: Ambulatory Visit | Attending: Obstetrics and Gynecology | Admitting: Obstetrics and Gynecology

## 2016-01-15 ENCOUNTER — Encounter (HOSPITAL_COMMUNITY): Payer: Self-pay | Admitting: *Deleted

## 2016-01-15 DIAGNOSIS — B9689 Other specified bacterial agents as the cause of diseases classified elsewhere: Secondary | ICD-10-CM

## 2016-01-15 DIAGNOSIS — N760 Acute vaginitis: Secondary | ICD-10-CM

## 2016-01-15 DIAGNOSIS — K5900 Constipation, unspecified: Secondary | ICD-10-CM | POA: Insufficient documentation

## 2016-01-15 DIAGNOSIS — E669 Obesity, unspecified: Secondary | ICD-10-CM | POA: Diagnosis not present

## 2016-01-15 DIAGNOSIS — R109 Unspecified abdominal pain: Secondary | ICD-10-CM | POA: Insufficient documentation

## 2016-01-15 LAB — URINALYSIS, ROUTINE W REFLEX MICROSCOPIC
Bilirubin Urine: NEGATIVE
GLUCOSE, UA: NEGATIVE mg/dL
Ketones, ur: NEGATIVE mg/dL
Leukocytes, UA: NEGATIVE
Nitrite: NEGATIVE
PH: 6 (ref 5.0–8.0)
Protein, ur: NEGATIVE mg/dL
Specific Gravity, Urine: 1.02 (ref 1.005–1.030)

## 2016-01-15 LAB — URINE MICROSCOPIC-ADD ON

## 2016-01-15 LAB — WET PREP, GENITAL
SPERM: NONE SEEN
Trich, Wet Prep: NONE SEEN
Yeast Wet Prep HPF POC: NONE SEEN

## 2016-01-15 LAB — POCT PREGNANCY, URINE: Preg Test, Ur: NEGATIVE

## 2016-01-15 MED ORDER — MEDROXYPROGESTERONE ACETATE 150 MG/ML IM SUSP
150.0000 mg | Freq: Once | INTRAMUSCULAR | Status: AC
  Administered 2016-01-15: 150 mg via INTRAMUSCULAR
  Filled 2016-01-15: qty 1

## 2016-01-15 MED ORDER — METRONIDAZOLE 0.75 % VA GEL: 1.0000 | Freq: Every day | VAGINAL | Status: DC

## 2016-01-15 NOTE — Discharge Instructions (Signed)
Medroxyprogesterone injection [Contraceptive] What is this medicine? MEDROXYPROGESTERONE (me DROX ee proe JES te rone) contraceptive injections prevent pregnancy. They provide effective birth control for 3 months. Depo-subQ Provera 104 is also used for treating pain related to endometriosis. This medicine may be used for other purposes; ask your health care provider or pharmacist if you have questions. What should I tell my health care provider before I take this medicine? They need to know if you have any of these conditions: -frequently drink alcohol -asthma -blood vessel disease or a history of a blood clot in the lungs or legs -bone disease such as osteoporosis -breast cancer -diabetes -eating disorder (anorexia nervosa or bulimia) -high blood pressure -HIV infection or AIDS -kidney disease -liver disease -mental depression -migraine -seizures (convulsions) -stroke -tobacco smoker -vaginal bleeding -an unusual or allergic reaction to medroxyprogesterone, other hormones, medicines, foods, dyes, or preservatives -pregnant or trying to get pregnant -breast-feeding How should I use this medicine? Depo-Provera Contraceptive injection is given into a muscle. Depo-subQ Provera 104 injection is given under the skin. These injections are given by a health care professional. You must not be pregnant before getting an injection. The injection is usually given during the first 5 days after the start of a menstrual period or 6 weeks after delivery of a baby. Talk to your pediatrician regarding the use of this medicine in children. Special care may be needed. These injections have been used in female children who have started having menstrual periods. Overdosage: If you think you have taken too much of this medicine contact a poison control center or emergency room at once. NOTE: This medicine is only for you. Do not share this medicine with others. What if I miss a dose? Try not to miss a  dose. You must get an injection once every 3 months to maintain birth control. If you cannot keep an appointment, call and reschedule it. If you wait longer than 13 weeks between Depo-Provera contraceptive injections or longer than 14 weeks between Depo-subQ Provera 104 injections, you could get pregnant. Use another method for birth control if you miss your appointment. You may also need a pregnancy test before receiving another injection. What may interact with this medicine? Do not take this medicine with any of the following medications: -bosentan This medicine may also interact with the following medications: -aminoglutethimide -antibiotics or medicines for infections, especially rifampin, rifabutin, rifapentine, and griseofulvin -aprepitant -barbiturate medicines such as phenobarbital or primidone -bexarotene -carbamazepine -medicines for seizures like ethotoin, felbamate, oxcarbazepine, phenytoin, topiramate -modafinil -St. John's wort This list may not describe all possible interactions. Give your health care provider a list of all the medicines, herbs, non-prescription drugs, or dietary supplements you use. Also tell them if you smoke, drink alcohol, or use illegal drugs. Some items may interact with your medicine. What should I watch for while using this medicine? This drug does not protect you against HIV infection (AIDS) or other sexually transmitted diseases. Use of this product may cause you to lose calcium from your bones. Loss of calcium may cause weak bones (osteoporosis). Only use this product for more than 2 years if other forms of birth control are not right for you. The longer you use this product for birth control the more likely you will be at risk for weak bones. Ask your health care professional how you can keep strong bones. You may have a change in bleeding pattern or irregular periods. Many females stop having periods while taking this drug. If you have  received your  injections on time, your chance of being pregnant is very low. If you think you may be pregnant, see your health care professional as soon as possible. Tell your health care professional if you want to get pregnant within the next year. The effect of this medicine may last a long time after you get your last injection. What side effects may I notice from receiving this medicine? Side effects that you should report to your doctor or health care professional as soon as possible: -allergic reactions like skin rash, itching or hives, swelling of the face, lips, or tongue -breast tenderness or discharge -breathing problems -changes in vision -depression -feeling faint or lightheaded, falls -fever -pain in the abdomen, chest, groin, or leg -problems with balance, talking, walking -unusually weak or tired -yellowing of the eyes or skin Side effects that usually do not require medical attention (report to your doctor or health care professional if they continue or are bothersome): -acne -fluid retention and swelling -headache -irregular periods, spotting, or absent periods -temporary pain, itching, or skin reaction at site where injected -weight gain This list may not describe all possible side effects. Call your doctor for medical advice about side effects. You may report side effects to FDA at 1-800-FDA-1088. Where should I keep my medicine? This does not apply. The injection will be given to you by a health care professional. NOTE: This sheet is a summary. It may not cover all possible information. If you have questions about this medicine, talk to your doctor, pharmacist, or health care provider.    2016, Elsevier/Gold Standard. (2009-01-02 18:37:56)  Bacterial Vaginosis Bacterial vaginosis is a vaginal infection that occurs when the normal balance of bacteria in the vagina is disrupted. It results from an overgrowth of certain bacteria. This is the most common vaginal infection in women  of childbearing age. Treatment is important to prevent complications, especially in pregnant women, as it can cause a premature delivery. CAUSES  Bacterial vaginosis is caused by an increase in harmful bacteria that are normally present in smaller amounts in the vagina. Several different kinds of bacteria can cause bacterial vaginosis. However, the reason that the condition develops is not fully understood. RISK FACTORS Certain activities or behaviors can put you at an increased risk of developing bacterial vaginosis, including:  Having a new sex partner or multiple sex partners.  Douching.  Using an intrauterine device (IUD) for contraception. Women do not get bacterial vaginosis from toilet seats, bedding, swimming pools, or contact with objects around them. SIGNS AND SYMPTOMS  Some women with bacterial vaginosis have no signs or symptoms. Common symptoms include:  Grey vaginal discharge.  A fishlike odor with discharge, especially after sexual intercourse.  Itching or burning of the vagina and vulva.  Burning or pain with urination. DIAGNOSIS  Your health care provider will take a medical history and examine the vagina for signs of bacterial vaginosis. A sample of vaginal fluid may be taken. Your health care provider will look at this sample under a microscope to check for bacteria and abnormal cells. A vaginal pH test may also be done.  TREATMENT  Bacterial vaginosis may be treated with antibiotic medicines. These may be given in the form of a pill or a vaginal cream. A second round of antibiotics may be prescribed if the condition comes back after treatment. Because bacterial vaginosis increases your risk for sexually transmitted diseases, getting treated can help reduce your risk for chlamydia, gonorrhea, HIV, and herpes. HOME CARE INSTRUCTIONS     Only take over-the-counter or prescription medicines as directed by your health care provider.  If antibiotic medicine was prescribed, take  it as directed. Make sure you finish it even if you start to feel better.  Tell all sexual partners that you have a vaginal infection. They should see their health care provider and be treated if they have problems, such as a mild rash or itching.  During treatment, it is important that you follow these instructions:  Avoid sexual activity or use condoms correctly.  Do not douche.  Avoid alcohol as directed by your health care provider.  Avoid breastfeeding as directed by your health care provider. SEEK MEDICAL CARE IF:   Your symptoms are not improving after 3 days of treatment.  You have increased discharge or pain.  You have a fever. MAKE SURE YOU:   Understand these instructions.  Will watch your condition.  Will get help right away if you are not doing well or get worse. FOR MORE INFORMATION  Centers for Disease Control and Prevention, Division of STD Prevention: [employer] American Sexual Health Association (ASHA): www.ashastd.org    This information is not intended to replace advice given to you by your health care provider. Make sure you discuss any questions you have with your health care provider.   Document Released: 12/12/2005 Document Revised: 01/02/2015 Document Reviewed: 07/24/2013 Elsevier Interactive Patient Education [employer]

## 2016-01-15 NOTE — MAU Provider Note (Signed)
History    Kathy Hill is a 25y.o. Female who presents, unannounced, for abdominal pain.  Patient reports pain started about 1-1.5wks ago and has been intermittent.  Patient states pain is sharp in nature and states pain relieved with direct pressure.  Patient reports pain worsens during sexual intercourse. Patient denies pain with urination and diarrhea, but reports constipation.  Patient reports constipation has been a chronic issue. Patient reports no birth control at current, but does not desire pregnancy.  Patient also reports discharge that has been present for about a week. It is milky color with odor noted yesterday.  Patient denies vaginal bleeding.     Patient Active Problem List   Diagnosis Date Noted  . Anal fissure 03/18/2014  . Rectal bleeding 03/18/2014  . Status post repeat low transverse cesarean section 11/15/2013  . S/P ectopic pregnancy 03/08/2012    Chief Complaint  Patient presents with  . Abdominal Pain   HPI  OB History    Gravida Para Term Preterm AB TAB SAB Ectopic Multiple Living   0 2 1 0 1 0 2      Past Medical History  Diagnosis Date  . Ectopic pregnancy   . Urinary tract infection     history only  . Eczema   . Ovarian cyst   . Genital herpes   . Infection     chlamydia history only  . Gonorrhea     history only  . Trichimoniasis     history only  . BV (bacterial vaginosis)     history only  . Obesity     Past Surgical History  Procedure Laterality Date  . Cesarean section  2011    [facility]  . Laparoscopy  02/18/2012    Procedure: LAPAROSCOPY OPERATIVE;  Surgeon: Catalina Antigua, MD;  Location: [facility];  Service: Gynecology;  Laterality: N/A;  Lysis of Adhesions, Operative laparoscopy for ectopic pregnancy, left salpingectomy,   . Therapeutic abortion    . Unilateral salpingectomy      right  . Cesarean section N/A 11/15/2013    Procedure: REPEAT CESAREAN SECTION;  Surgeon: Kirkland Hun, MD;  Location: [facility];  Service:  Obstetrics;  Laterality: N/A;    Family History  Problem Relation Age of Onset  . Anesthesia problems Neg Hx   . Hypotension Neg Hx   . Malignant hyperthermia Neg Hx   . Pseudochol deficiency Neg Hx   . Other Neg Hx   . Thyroid disease Mother   . Kidney disease Father     on dylasis  . Hypertension Father   . Breast cancer Paternal Grandmother   . Diabetes Paternal Grandmother     type 2  . Colon polyps      pat side  . Heart disease Father     Social History  Substance Use Topics  . Smoking status: Never Smoker   . Smokeless tobacco: Never Used  . Alcohol Use: No    Allergies: No Known Allergies  No prescriptions prior to admission    ROS  See HPI Above Physical Exam   Temperature 98.6 F (37 C), resp. rate 18, height  (1.676 m), weight 107.775 kg (237 lb 9.6 oz), last menstrual period 12/18/2015.  Results for orders placed or performed during the hospital encounter of 01/15/16 (from the past 24 hour(s))  Urinalysis, Routine w reflex microscopic (not at [facility])     Status: Abnormal   Collection Time: 01/15/16  7:59 PM  Result Value  Ref Range   Color, Urine YELLOW YELLOW   APPearance CLEAR CLEAR   Specific Gravity, Urine 1.020 1.005 - 1.030   pH 6.0 5.0 - 8.0   Glucose, UA NEGATIVE NEGATIVE mg/dL   Hgb urine dipstick TRACE (A) NEGATIVE   Bilirubin Urine NEGATIVE NEGATIVE   Ketones, ur NEGATIVE NEGATIVE mg/dL   Protein, ur NEGATIVE NEGATIVE mg/dL   Nitrite NEGATIVE NEGATIVE   Leukocytes, UA NEGATIVE NEGATIVE  Urine microscopic-add on     Status: Abnormal   Collection Time: 01/15/16  7:59 PM  Result Value Ref Range   Squamous Epithelial / LPF 0-5 (A) NONE SEEN   WBC, UA 0-5 0 - 5 WBC/hpf   RBC / HPF 0-5 0 - 5 RBC/hpf   Bacteria, UA FEW (A) NONE SEEN  Pregnancy, urine POC     Status: None   Collection Time: 01/15/16  8:09 PM  Result Value Ref Range   Preg Test, Ur NEGATIVE NEGATIVE  Wet prep, genital     Status: Abnormal   Collection Time: 01/15/16   8:40 PM  Result Value Ref Range   Yeast Wet Prep HPF POC NONE SEEN NONE SEEN   Trich, Wet Prep NONE SEEN NONE SEEN   Clue Cells Wet Prep HPF POC PRESENT (A) NONE SEEN   WBC, Wet Prep HPF POC MODERATE (A) NONE SEEN   Sperm NONE SEEN     Physical Exam  Vitals reviewed. Constitutional: She is oriented to person, place, and time. She appears well-developed and well-nourished. No distress.  HENT:  Head: Normocephalic and atraumatic.  Neck: Normal range of motion.  Respiratory: Effort normal.  Genitourinary: Cervix exhibits motion tenderness and discharge. Cervix exhibits no friability. No bleeding in the vagina. Vaginal discharge found.  Sterile Speculum Exam: -Vaginal Vault: Moderate amt thin white discharge -wet prep collected -Cervix:Inflammatino of OS. Clear Mucoid discharge from os-GC/CT collected -Bimanual Exam: Closed/+CMT, UTA Left Adnexa    Musculoskeletal: Normal range of motion.  Neurological: She is alert and oriented to person, place, and time.  Skin: Skin is warm and dry.     ED Course  Assessment: 25 y.o. Female Abdominal Pain Constipation Desires Contraception  Plan: -Patient requests all STD testing -PE as above -Labs: Wet Prep, STD Panel-HepB, HepC, RPR, HIV, GC/CT, UA -Discussed vaginal hygiene in midst of reported BV symptoms (foul odor, thin discharge, pain with sex) -Discussed cervicitis and treatment with vaginal antifungal as BV most likely source of inflammation  -Discussed lack of contraception and offered depo provera-patient accepts -R/B of depo provera discussed including but not limited to pregnancy, irregular bleeding, weight gain, increased blood clots, and mood swings  Follow Up (2140) -Wet Prep +Clue Cells -Depo Provera Given -Informed of need to follow up in 11-12 wks for repeat injection -RX sent via [facility] as well as message -Q/C addressed -Rx for metrogel sent -Encouraged to call if any questions or concerns arise prior to next  scheduled office visit.  -Discharged to home in stable condition  Cherre Robins CNM, MSN 01/15/2016 8:11 PM

## 2016-01-15 NOTE — MAU Note (Addendum)
Having some abdominal pains for 2 weeks.  Some vag d/c that has foul odor. Milky color. Some vaginal itching. Abd pain is worse with intercourse

## 2016-01-15 NOTE — Progress Notes (Signed)
Provider notified to contact lab regarding a question on an order and notified of wet prep results.

## 2016-01-16 LAB — HEPATITIS B SURFACE ANTIGEN: HEP B S AG: NEGATIVE

## 2016-01-16 LAB — RPR: RPR: NONREACTIVE

## 2016-01-17 LAB — HIV ANTIBODY (ROUTINE TESTING W REFLEX): HIV Screen 4th Generation wRfx: NONREACTIVE

## 2016-01-17 LAB — HEPATITIS C ANTIBODY: HCV Ab: <0.1 s/co ratio (ref 0.0–0.9)

## 2016-01-18 LAB — GC/CHLAMYDIA PROBE AMP (~~LOC~~) NOT AT ARMC
Chlamydia: NEGATIVE
Neisseria Gonorrhea: NEGATIVE

## 2016-11-08 ENCOUNTER — Encounter (HOSPITAL_BASED_OUTPATIENT_CLINIC_OR_DEPARTMENT_OTHER): Payer: Self-pay | Admitting: Emergency Medicine

## 2016-11-08 ENCOUNTER — Emergency Department (HOSPITAL_BASED_OUTPATIENT_CLINIC_OR_DEPARTMENT_OTHER)
Admission: EM | Admit: 2016-11-08 | Discharge: 2016-11-08 | Disposition: A | Discharge status: Home / Self Care | Payer: Managed Care, Other (non HMO) | Attending: Emergency Medicine | Admitting: Emergency Medicine

## 2016-11-08 DIAGNOSIS — J069 Acute upper respiratory infection, unspecified: Secondary | ICD-10-CM | POA: Insufficient documentation

## 2016-11-08 DIAGNOSIS — H109 Unspecified conjunctivitis: Secondary | ICD-10-CM | POA: Insufficient documentation

## 2016-11-08 DIAGNOSIS — J020 Streptococcal pharyngitis: Secondary | ICD-10-CM

## 2016-11-08 MED ORDER — AMOXICILLIN 500 MG PO CAPS: 500.0000 mg | ORAL_CAPSULE | Freq: Three times a day (TID) | ORAL | 0 refills | Status: DC

## 2016-11-08 MED ORDER — SULFACETAMIDE SODIUM 10 % OP SOLN: 1.0000 [drp] | OPHTHALMIC | 0 refills | Status: DC

## 2016-11-08 MED FILL — AMOXICILLIN 500 MG CAPSULE: 500 | 7 days supply | Qty: 21 | Fill #0

## 2016-11-08 MED FILL — SULFACETAMIDE 10% EYE DROPS: 10 | 13 days supply | Qty: 15 | Fill #0

## 2016-11-08 NOTE — ED Provider Notes (Addendum)
[facility]   CSN: [id number] Arrival date & time: 11/08/16  [id number]     History   Chief Complaint Chief Complaint  Patient presents with  . Cough    HPI Kathy Hill is a 25 y.o. female.  Patient is a 25 year old female with no significant past medical history. She presents with a 3 day history of cough, congestion, and woke this morning with body aches. She denies any fevers or chills. She denies any vomiting or diarrhea. She does report her children are ill in a similar fashion.   The history is provided by the patient.  Cough  This is a new problem. Episode onset: 3 days ago. The problem occurs constantly. The problem has been gradually worsening. The cough is non-productive. There has been no fever. Pertinent negatives include no chest pain. She has tried nothing for the symptoms.    Past Medical History:  Diagnosis Date  . BV (bacterial vaginosis)    history only  . Ectopic pregnancy   . Eczema   . Genital herpes   . Gonorrhea    history only  . Infection    chlamydia history only  . Obesity   . Ovarian cyst   . Trichimoniasis    history only  . Urinary tract infection    history only    Patient Active Problem List   Diagnosis Date Noted  . Anal fissure 03/18/2014  . Rectal bleeding 03/18/2014  . Status post repeat low transverse cesarean section 11/15/2013  . S/P ectopic pregnancy 03/08/2012    Past Surgical History:  Procedure Laterality Date  . [facility]  . CESAREAN SECTION N/A 11/15/2013   Procedure: REPEAT CESAREAN SECTION;  Surgeon: Kirkland HunArthur Stringer, MD;  Location: [facility];  Service: Obstetrics;  Laterality: N/A;  . LAPAROSCOPY  02/18/2012   Procedure: LAPAROSCOPY OPERATIVE;  Surgeon: Catalina AntiguaPeggy Constant, MD;  Location: [facility];  Service: Gynecology;  Laterality: N/A;  Lysis of Adhesions, Operative laparoscopy for ectopic pregnancy, left salpingectomy,   . THERAPEUTIC ABORTION    . UNILATERAL  SALPINGECTOMY     right    OB History    Gravida Para Term Preterm AB Living   4 2 2  0 2 2   SAB TAB Ectopic Multiple Live Births   0 1 1 0 2       Home Medications    Prior to Admission medications   Medication Sig Start Date End Date Taking? Authorizing Provider  metroNIDAZOLE (METROGEL VAGINAL) 0.75 % vaginal gel Place 1 Applicatorful vaginally at bedtime. Insert one applicator, at bedtime, for 5 nights. 01/15/16   Gerrit HeckJessica Emly, CNM    Family History Family History  Problem Relation Age of Onset  . Thyroid disease Mother   . Kidney disease Father     on dylasis  . Hypertension Father   . Heart disease Father   . Breast cancer Paternal Grandmother   . Diabetes Paternal Grandmother     type 2  . Colon polyps      pat side  . Anesthesia problems Neg Hx   . Hypotension Neg Hx   . Malignant hyperthermia Neg Hx   . Pseudochol deficiency Neg Hx   . Other Neg Hx     Social History Social History  Substance Use Topics  . Smoking status: Never Smoker  . Smokeless tobacco: Never Used  . Alcohol use No     Allergies   Patient has no known allergies.  Review of Systems Review of Systems  Respiratory: Positive for cough.   Cardiovascular: Negative for chest pain.  All other systems reviewed and are negative.    Physical Exam Updated Vital Signs BP 137/87 (BP Location: Left Arm)   Pulse 103   Temp 98.4 F (36.9 C) (Oral)   Resp 18   Ht 5\' 6"  (1.676 m)   Wt 250 lb (113.4 kg)   SpO2 97%   BMI 40.35 kg/m   Physical Exam  Constitutional: She is oriented to person, place, and time. She appears well-developed and well-nourished. No distress.  HENT:  Head: Normocephalic and atraumatic.  Mouth/Throat: Oropharynx is clear and moist. No oropharyngeal exudate.  TMs are clear bilaterally.  Neck: Normal range of motion. Neck supple.  Cardiovascular: Normal rate and regular rhythm.  Exam reveals no gallop and no friction rub.   No murmur  heard. Pulmonary/Chest: Effort normal and breath sounds normal. No respiratory distress. She has no wheezes.  Abdominal: Soft. Bowel sounds are normal. She exhibits no distension. There is no tenderness.  Musculoskeletal: Normal range of motion.  Lymphadenopathy:    She has no cervical adenopathy.  Neurological: She is alert and oriented to person, place, and time.  Skin: Skin is warm and dry. She is not diaphoretic.  Nursing note and vitals reviewed.    ED Treatments / Results  Labs (all labs ordered are listed, but only abnormal results are displayed) Labs Reviewed - No data to display  EKG  EKG Interpretation None       Radiology No results found.  Procedures Procedures (including critical care time)  Medications Ordered in ED Medications - No data to display   Initial Impression / Assessment and Plan / ED Course  I have reviewed the triage vital signs and the nursing notes.  Pertinent labs & imaging results that were available during my care of the patient were reviewed by me and considered in my medical decision making (see chart for details).  Clinical Course     Patient presents with a three-day history of URI symptoms. I highly suspect a viral etiology, however her cousin who is being seen in the next room with him she lives tested positive for strep.Marland Kitchen. Her lungs are clear and physical examination is unremarkable. She does have somewhat of a hoarse voice but there is no stridor. She will be discharged with amoxicillin, over-the-counter medications and when necessary return.  Final Clinical Impressions(s) / ED Diagnoses   Final diagnoses:  None    New Prescriptions New Prescriptions   No medications on file     Geoffery Lyonsouglas Karuna Balducci, MD 11/08/16 [id number]    Geoffery Lyonsouglas Devany Aja, MD 11/08/16 [phone]

## 2016-11-08 NOTE — Discharge Instructions (Signed)
Ibuprofen 600 mg every 6 hours as needed for pain or fever.  Over-the-counter cough and cold medications as needed for symptomatic relief.  Return to the emergency department if you develop difficulty breathing, severe chest pain, or other new and concerning symptoms.

## 2016-11-08 NOTE — ED Triage Notes (Signed)
Patient reports that she is having pain in her throat - SOB and Cough. The patient also reports that she is having some generalized aches - Trying OTC remedies

## 2017-09-11 ENCOUNTER — Emergency Department (HOSPITAL_BASED_OUTPATIENT_CLINIC_OR_DEPARTMENT_OTHER): Payer: 59

## 2017-09-11 ENCOUNTER — Emergency Department (HOSPITAL_BASED_OUTPATIENT_CLINIC_OR_DEPARTMENT_OTHER)
Admission: EM | Admit: 2017-09-11 | Discharge: 2017-09-11 | Disposition: A | Discharge status: Home / Self Care | Payer: 59 | Attending: Emergency Medicine | Admitting: Emergency Medicine

## 2017-09-11 ENCOUNTER — Encounter (HOSPITAL_BASED_OUTPATIENT_CLINIC_OR_DEPARTMENT_OTHER): Payer: Self-pay

## 2017-09-11 DIAGNOSIS — R102 Pelvic and perineal pain unspecified side: Secondary | ICD-10-CM

## 2017-09-11 DIAGNOSIS — N760 Acute vaginitis: Secondary | ICD-10-CM | POA: Diagnosis not present

## 2017-09-11 DIAGNOSIS — B9689 Other specified bacterial agents as the cause of diseases classified elsewhere: Secondary | ICD-10-CM

## 2017-09-11 LAB — URINALYSIS, ROUTINE W REFLEX MICROSCOPIC
Bilirubin Urine: NEGATIVE
Glucose, UA: NEGATIVE mg/dL
Hgb urine dipstick: NEGATIVE
Ketones, ur: NEGATIVE mg/dL
LEUKOCYTES UA: NEGATIVE
NITRITE: NEGATIVE
PROTEIN: NEGATIVE mg/dL
SPECIFIC GRAVITY, URINE: 1.02 (ref 1.005–1.030)
pH: 7 (ref 5.0–8.0)

## 2017-09-11 LAB — WET PREP, GENITAL
Sperm: NONE SEEN
Trich, Wet Prep: NONE SEEN
YEAST WET PREP: NONE SEEN

## 2017-09-11 LAB — PREGNANCY, URINE: PREG TEST UR: NEGATIVE

## 2017-09-11 MED ORDER — METRONIDAZOLE 500 MG PO TABS: 500.0000 mg | ORAL_TABLET | Freq: Two times a day (BID) | ORAL | 0 refills | Status: DC

## 2017-09-11 MED ORDER — IBUPROFEN 400 MG PO TABS
600.0000 mg | ORAL_TABLET | Freq: Once | ORAL | Status: AC
  Administered 2017-09-11: 600 mg via ORAL
  Filled 2017-09-11: qty 1

## 2017-09-11 MED ORDER — AZITHROMYCIN 250 MG PO TABS
1000.0000 mg | ORAL_TABLET | Freq: Once | ORAL | Status: AC
  Administered 2017-09-11: 1000 mg via ORAL
  Filled 2017-09-11: qty 4

## 2017-09-11 MED ORDER — CEFTRIAXONE SODIUM 250 MG IJ SOLR
250.0000 mg | Freq: Once | INTRAMUSCULAR | Status: AC
  Administered 2017-09-11: 250 mg via INTRAMUSCULAR
  Filled 2017-09-11: qty 250

## 2017-09-11 NOTE — ED Triage Notes (Signed)
C/o pelvic pain, vaginal d/c x 1 week-NAD-steady gait

## 2017-09-11 NOTE — Discharge Instructions (Signed)
Take antibiotics for BV infection. You have a small fibroid in the uterus. This could cause some pain. Continue ibuprofen and heat packs Return for worsening symptoms, including fever, worsening pain, intractable vomiting or any other symptoms concerning to you.

## 2017-09-11 NOTE — ED Provider Notes (Signed)
[facility] Provider Note   CSN: [id number] Arrival date & time: 09/11/17  1816     History   Chief Complaint Chief Complaint  Patient presents with  . Pelvic Pain    HPI Kathy Hill is a 26 y.o. female.  HPI 26 year old female who presents with one week of intermittent pelvic pain. She has a history of STDs, laparoscopy for ectopic pregnancy, and previous cesarean section. Denies any dyspareunia, fevers, vomiting. Has had some loose stools recently with nausea and intermittent vaginal discharge. No abnormal vaginal bleeding. No dysuria but has mild frequency of urine. Reports sexually active with one partner, but does not use any protection. He is concerned about potential STD exposure.   Past Medical History:  Diagnosis Date  . BV (bacterial vaginosis)    history only  . Ectopic pregnancy   . Eczema   . Genital herpes   . Gonorrhea    history only  . Infection    chlamydia history only  . Obesity   . Ovarian cyst   . Trichimoniasis    history only  . Urinary tract infection    history only    Patient Active Problem List   Diagnosis Date Noted  . Anal fissure 03/18/2014  . Rectal bleeding 03/18/2014  . Status post repeat low transverse cesarean section 11/15/2013  . S/P ectopic pregnancy 03/08/2012    Past Surgical History:  Procedure Laterality Date  . CESAREAN SECTION  2011   [facility]  . CESAREAN SECTION N/A 11/15/2013   Procedure: REPEAT CESAREAN SECTION;  Surgeon: Kirkland Hun, MD;  Location: [facility];  Service: Obstetrics;  Laterality: N/A;  . LAPAROSCOPY  02/18/2012   Procedure: LAPAROSCOPY OPERATIVE;  Surgeon: Catalina Antigua, MD;  Location: [facility];  Service: Gynecology;  Laterality: N/A;  Lysis of Adhesions, Operative laparoscopy for ectopic pregnancy, left salpingectomy,   . THERAPEUTIC ABORTION    . UNILATERAL SALPINGECTOMY     right    OB History    Gravida Para Term Preterm AB Living   0 2 2   SAB TAB Ectopic Multiple  Live Births   0 1 1 0 2       Home Medications    Prior to Admission medications   Medication Sig Start Date End Date Taking? Authorizing Provider  metroNIDAZOLE (FLAGYL) 500 MG tablet Take 1 tablet (500 mg total) by mouth 2 (two) times daily. 09/11/17   Lavera Guise, MD    Family History Family History  Problem Relation Age of Onset  . Thyroid disease Mother   . Kidney disease Father        on dylasis  . Hypertension Father   . Heart disease Father   . Breast cancer Paternal Grandmother   . Diabetes Paternal Grandmother        type 2  . Colon polyps Unknown        pat side  . Anesthesia problems Neg Hx   . Hypotension Neg Hx   . Malignant hyperthermia Neg Hx   . Pseudochol deficiency Neg Hx   . Other Neg Hx     Social History Social History  Substance Use Topics  . Smoking status: Never Smoker  . Smokeless tobacco: Never Used  . Alcohol use No     Allergies   Patient has no known allergies.   Review of Systems Review of Systems  Constitutional: Negative for fever.  Respiratory: Negative for shortness of breath.   Cardiovascular: Negative for  chest pain.  Gastrointestinal: Positive for diarrhea. Negative for blood in stool and vomiting.  Genitourinary: Positive for pelvic pain and vaginal discharge. Negative for vaginal bleeding.  All other systems reviewed and are negative.    Physical Exam Updated Vital Signs BP 125/81 (BP Location: Left Arm)   Pulse 92   Temp 99 F (37.2 C) (Oral)   Resp 18   Ht  (1.676 m)   Wt 112 kg (247 lb)   LMP 08/28/2017   SpO2 99%   BMI 39.87 kg/m   Physical Exam Physical Exam  Nursing note and vitals reviewed. Constitutional: Well developed, well nourished, non-toxic, and in no acute distress Head: Normocephalic and atraumatic.  Mouth/Throat: Oropharynx is clear and moist.  Neck: Normal range of motion. Neck supple.  Cardiovascular: Normal rate and regular rhythm.   Pulmonary/Chest: Effort normal and  breath sounds normal.  Abdominal: Soft. There is low abdominal tenderness. There is no rebound and no guarding.  Musculoskeletal: Normal range of motion.  Neurological: Alert, no facial droop, fluent speech, moves all extremities symmetrically Skin: Skin is warm and dry.  Psychiatric: Cooperative Pelvic: Normal external genitalia. Normal internal genitalia. No discharge. No blood within the vagina. No cervical motion tenderness. No adnexal masses. Mild low pelvic tenderness bilaterally   ED Treatments / Results  Labs (all labs ordered are listed, but only abnormal results are displayed) Labs Reviewed  WET PREP, GENITAL - Abnormal; Notable for the following:       Result Value   Clue Cells Wet Prep HPF POC PRESENT (*)    WBC, Wet Prep HPF POC FEW (*)    All other components within normal limits  URINALYSIS, ROUTINE W REFLEX MICROSCOPIC - Abnormal; Notable for the following:    APPearance CLOUDY (*)    All other components within normal limits  PREGNANCY, URINE  HIV ANTIBODY (ROUTINE TESTING)  RPR  GC/CHLAMYDIA PROBE AMP ([facility]) NOT AT [facility]    EKG  EKG Interpretation None       Radiology US Pelvis Transvanginal Non-ob (tv Only)  Result Date: 09/11/2017 CLINICAL DATA:  Left-sided pelvic pain for 1 week. EXAM: TRANSABDOMINAL AND TRANSVAGINAL ULTRASOUND OF PELVIS DOPPLER ULTRASOUND OF OVARIES TECHNIQUE: Both transabdominal and transvaginal ultrasound examinations of the pelvis were performed. Transabdominal technique was performed for global imaging of the pelvis including uterus, ovaries, adnexal regions, and pelvic cul-de-sac. It was necessary to proceed with endovaginal exam following the transabdominal exam to visualize the uterus, endometrium and ovaries to better advantage. Color and duplex Doppler ultrasound was utilized to evaluate blood flow to the ovaries. COMPARISON:  None. FINDINGS: Uterus Measurements: 10.3 x 5.5 x 5.8 cm. Small heterogeneous 1.3 cm mass consistent  with a submucosal fibroid. This lies along the left posterior mid uterine segment. No other uterine masses. C-section scar noted along low anterior uterus. Endometrium Thickness: 15 mm.  No focal abnormality visualized. Right ovary Measurements: 3.9 x 2.6 x 2.9 cm. Normal appearance/no adnexal mass. Left ovary Measurements: 3.3 x 2.5 x 3.7 cm. Normal appearance/no adnexal mass. Pulsed Doppler evaluation of both ovaries demonstrates normal low-resistance arterial and venous waveforms. Other findings No abnormal free fluid. IMPRESSION: 1. No acute findings.  No findings to account for left pelvic pain. 2. 13 mm submucosal uterine fibroid. Electronically Signed   By: Amie Portland M.D.   On: 09/11/2017 20:50   US Pelvis Complete  Result Date: 09/11/2017 CLINICAL DATA:  Left-sided pelvic pain for 1 week. EXAM: TRANSABDOMINAL AND TRANSVAGINAL ULTRASOUND OF PELVIS  DOPPLER ULTRASOUND OF OVARIES TECHNIQUE: Both transabdominal and transvaginal ultrasound examinations of the pelvis were performed. Transabdominal technique was performed for global imaging of the pelvis including uterus, ovaries, adnexal regions, and pelvic cul-de-sac. It was necessary to proceed with endovaginal exam following the transabdominal exam to visualize the uterus, endometrium and ovaries to better advantage. Color and duplex Doppler ultrasound was utilized to evaluate blood flow to the ovaries. COMPARISON:  None. FINDINGS: Uterus Measurements: 10.3 x 5.5 x 5.8 cm. Small heterogeneous 1.3 cm mass consistent with a submucosal fibroid. This lies along the left posterior mid uterine segment. No other uterine masses. C-section scar noted along low anterior uterus. Endometrium Thickness: 15 mm.  No focal abnormality visualized. Right ovary Measurements: 3.9 x 2.6 x 2.9 cm. Normal appearance/no adnexal mass. Left ovary Measurements: 3.3 x 2.5 x 3.7 cm. Normal appearance/no adnexal mass. Pulsed Doppler evaluation of both ovaries demonstrates normal  low-resistance arterial and venous waveforms. Other findings No abnormal free fluid. IMPRESSION: 1. No acute findings.  No findings to account for left pelvic pain. 2. 13 mm submucosal uterine fibroid. Electronically Signed   By: Amie Portland M.D.   On: 09/11/2017 20:50   US Abdominal Pelvic Art/vent Flow Doppler  Result Date: 09/11/2017 CLINICAL DATA:  Left-sided pelvic pain for 1 week. EXAM: TRANSABDOMINAL AND TRANSVAGINAL ULTRASOUND OF PELVIS DOPPLER ULTRASOUND OF OVARIES TECHNIQUE: Both transabdominal and transvaginal ultrasound examinations of the pelvis were performed. Transabdominal technique was performed for global imaging of the pelvis including uterus, ovaries, adnexal regions, and pelvic cul-de-sac. It was necessary to proceed with endovaginal exam following the transabdominal exam to visualize the uterus, endometrium and ovaries to better advantage. Color and duplex Doppler ultrasound was utilized to evaluate blood flow to the ovaries. COMPARISON:  None. FINDINGS: Uterus Measurements: 10.3 x 5.5 x 5.8 cm. Small heterogeneous 1.3 cm mass consistent with a submucosal fibroid. This lies along the left posterior mid uterine segment. No other uterine masses. C-section scar noted along low anterior uterus. Endometrium Thickness: 15 mm.  No focal abnormality visualized. Right ovary Measurements: 3.9 x 2.6 x 2.9 cm. Normal appearance/no adnexal mass. Left ovary Measurements: 3.3 x 2.5 x 3.7 cm. Normal appearance/no adnexal mass. Pulsed Doppler evaluation of both ovaries demonstrates normal low-resistance arterial and venous waveforms. Other findings No abnormal free fluid. IMPRESSION: 1. No acute findings.  No findings to account for left pelvic pain. 2. 13 mm submucosal uterine fibroid. Electronically Signed   By: Amie Portland M.D.   On: 09/11/2017 20:50    Procedures Procedures (including critical care time)  Medications Ordered in ED Medications  cefTRIAXone (ROCEPHIN) injection 250 mg (not  administered)  azithromycin (ZITHROMAX) tablet 1,000 mg (not administered)  ibuprofen (ADVIL,MOTRIN) tablet 600 mg (600 mg Oral Given 09/11/17 1957)     Initial Impression / Assessment and Plan / ED Course  I have reviewed the triage vital signs and the nursing notes.  Pertinent labs & imaging results that were available during my care of the patient were reviewed by me and considered in my medical decision making (see chart for details).     Presents with intermittent pelvic pain for 1 week. she is well-appearing with normal vital signs. Abdomen is overall soft and benign, with suprapubic and low pelvic tenderness to palpation. She is concerned about STD exposure, and requested that she be empirically treated today. On her wet prep does show evidence of BV for which she'll receive a week of Flagyl. Empirically given ceftriaxone and azithromycin. No evidence of  cervical motion tenderness or concerns for pelvic inflammatory disease. Pelvic ultrasound was performed, shows evidence of a small fibroid, that could be contributing to her pelvic pain. No evidence of torsion, cysts, or other intrapelvic processes. Low suspicion for intraabdominal processes such as appendicitis.   She will continue with supportive care management for pelvic pain. Strict return and follow-up instructions reviewed. She expressed understanding of all discharge instructions and felt comfortable with the plan of care.   Final Clinical Impressions(s) / ED Diagnoses   Final diagnoses:  Pelvic pain  BV (bacterial vaginosis)    New Prescriptions New Prescriptions   METRONIDAZOLE (FLAGYL) 500 MG TABLET    Take 1 tablet (500 mg total) by mouth 2 (two) times daily.     Lavera Guise, MD 09/11/17 [phone]

## 2017-09-12 LAB — GC/CHLAMYDIA PROBE AMP (~~LOC~~) NOT AT ARMC
CHLAMYDIA, DNA PROBE: NEGATIVE
Neisseria Gonorrhea: NEGATIVE

## 2017-09-13 LAB — HIV ANTIBODY (ROUTINE TESTING W REFLEX): HIV SCREEN 4TH GENERATION: NONREACTIVE

## 2017-09-13 LAB — RPR: RPR Ser Ql: NONREACTIVE

## 2017-11-10 LAB — OB RESULTS CONSOLE HIV ANTIBODY (ROUTINE TESTING): HIV: NONREACTIVE

## 2017-11-10 LAB — OB RESULTS CONSOLE ABO/RH: RH TYPE: POSITIVE

## 2017-11-10 LAB — OB RESULTS CONSOLE ANTIBODY SCREEN: ANTIBODY SCREEN: NEGATIVE

## 2017-11-10 LAB — OB RESULTS CONSOLE GC/CHLAMYDIA
Chlamydia: NEGATIVE
Gonorrhea: NEGATIVE

## 2017-11-10 LAB — OB RESULTS CONSOLE HEPATITIS B SURFACE ANTIGEN: HEP B S AG: NEGATIVE

## 2017-11-10 LAB — OB RESULTS CONSOLE RUBELLA ANTIBODY, IGM: RUBELLA: IMMUNE

## 2017-11-10 LAB — OB RESULTS CONSOLE RPR: RPR: NONREACTIVE

## 2018-04-03 ENCOUNTER — Encounter (HOSPITAL_COMMUNITY): Payer: Self-pay | Admitting: *Deleted

## 2018-04-03 ENCOUNTER — Inpatient Hospital Stay (HOSPITAL_COMMUNITY)
Admission: AD | Admit: 2018-04-03 | Discharge: 2018-04-03 | Disposition: A | Discharge status: Home / Self Care | Payer: 59 | Source: Ambulatory Visit | Attending: Obstetrics and Gynecology | Admitting: Obstetrics and Gynecology

## 2018-04-03 DIAGNOSIS — R109 Unspecified abdominal pain: Secondary | ICD-10-CM | POA: Diagnosis not present

## 2018-04-03 DIAGNOSIS — Z3A27 27 weeks gestation of pregnancy: Secondary | ICD-10-CM | POA: Insufficient documentation

## 2018-04-03 DIAGNOSIS — R1032 Left lower quadrant pain: Secondary | ICD-10-CM | POA: Diagnosis present

## 2018-04-03 DIAGNOSIS — R1013 Epigastric pain: Secondary | ICD-10-CM | POA: Diagnosis not present

## 2018-04-03 DIAGNOSIS — O26892 Other specified pregnancy related conditions, second trimester: Secondary | ICD-10-CM

## 2018-04-03 DIAGNOSIS — O99612 Diseases of the digestive system complicating pregnancy, second trimester: Secondary | ICD-10-CM | POA: Insufficient documentation

## 2018-04-03 DIAGNOSIS — K219 Gastro-esophageal reflux disease without esophagitis: Secondary | ICD-10-CM | POA: Insufficient documentation

## 2018-04-03 LAB — COMPREHENSIVE METABOLIC PANEL
ALT: 25 U/L (ref 14–54)
AST: 21 U/L (ref 15–41)
Albumin: 3.1 g/dL — ABNORMAL LOW (ref 3.5–5.0)
Alkaline Phosphatase: 59 U/L (ref 38–126)
Anion gap: 9 (ref 5–15)
CHLORIDE: 105 mmol/L (ref 101–111)
CO2: 19 mmol/L — ABNORMAL LOW (ref 22–32)
CREATININE: 0.47 mg/dL (ref 0.44–1.00)
Calcium: 8.6 mg/dL — ABNORMAL LOW (ref 8.9–10.3)
GFR calc Af Amer: >60 mL/min (ref >60)
Glucose, Bld: 93 mg/dL (ref 65–99)
Potassium: 3.8 mmol/L (ref 3.5–5.1)
Sodium: 133 mmol/L — ABNORMAL LOW (ref 135–145)
Total Bilirubin: 0.8 mg/dL (ref 0.3–1.2)
Total Protein: 6.7 g/dL (ref 6.5–8.1)

## 2018-04-03 LAB — URINALYSIS, ROUTINE W REFLEX MICROSCOPIC
Bilirubin Urine: NEGATIVE
Glucose, UA: NEGATIVE mg/dL
Hgb urine dipstick: NEGATIVE
Ketones, ur: NEGATIVE mg/dL
LEUKOCYTES UA: NEGATIVE
Nitrite: NEGATIVE
PROTEIN: NEGATIVE mg/dL
Specific Gravity, Urine: 1.019 (ref 1.005–1.030)
pH: 8 (ref 5.0–8.0)

## 2018-04-03 LAB — LIPASE, BLOOD: Lipase: 23 U/L (ref 11–51)

## 2018-04-03 LAB — CBC WITH DIFFERENTIAL/PLATELET
BASOS ABS: 0 10*3/uL (ref 0.0–0.1)
Basophils Relative: 0 %
EOS PCT: 1 %
Eosinophils Absolute: 0.1 10*3/uL (ref 0.0–0.7)
HEMATOCRIT: 36.6 % (ref 36.0–46.0)
HEMOGLOBIN: 12.5 g/dL (ref 12.0–15.0)
LYMPHS PCT: 19 %
Lymphs Abs: 2.6 10*3/uL (ref 0.7–4.0)
MCH: 29.5 pg (ref 26.0–34.0)
MCHC: 34.2 g/dL (ref 30.0–36.0)
MCV: 86.3 fL (ref 78.0–100.0)
Monocytes Absolute: 0.5 10*3/uL (ref 0.1–1.0)
Monocytes Relative: 3 %
NEUTROS ABS: 10.9 10*3/uL — AB (ref 1.7–7.7)
NEUTROS PCT: 77 %
PLATELETS: 254 10*3/uL (ref 150–400)
RBC: 4.24 MIL/uL (ref 3.87–5.11)
RDW: 14.4 % (ref 11.5–15.5)
WBC: 14.1 10*3/uL — AB (ref 4.0–10.5)

## 2018-04-03 LAB — AMYLASE: AMYLASE: 51 U/L (ref 28–100)

## 2018-04-03 LAB — FETAL FIBRONECTIN: Fetal Fibronectin: NEGATIVE

## 2018-04-03 MED ORDER — FAMOTIDINE 20 MG PO TABS: 20.0000 mg | ORAL_TABLET | Freq: Two times a day (BID) | ORAL | 3 refills | Status: AC

## 2018-04-03 MED ORDER — GI COCKTAIL ~~LOC~~
30.0000 mL | Freq: Once | ORAL | Status: AC
  Administered 2018-04-03: 30 mL via ORAL
  Filled 2018-04-03: qty 30

## 2018-04-03 NOTE — MAU Provider Note (Signed)
CC:  Chief Complaint  Patient presents with  . Abdominal Pain     First Provider Initiated Contact with Patient 04/03/18 [phone]      HPI: Kathy Hill is a 27 y.o. year old G11P2022 female at [redacted]w[redacted]d weeks gestation who presents to MAU reporting LLQ pain and RUQ pain x several weeks that has worsened over the past two weeks. It woke her up from sleep this morning and was 8/10 on the pain scale, much less now. RUQ pain has been 5/10 at worst. Gets prenatal care at [facility]. Mentioned it as her last prenatal visit. No testing. Was instructed to F/U if it worsened.   Hx ovarian cyst, C/S x 2, left salpingectomy and maybe fibroids.   Associated Sx: Neg for fever, chills, N/V/D/C, urinary complaints, vaginal discharge.  Vaginal bleeding: Denies Leaking of fluid: Denies Fetal movement: Nml  OB History  Gravida Para Term Preterm AB Living  5 2 2  0 2 2  SAB TAB Ectopic Multiple Live Births  0 1 1 0 2    # Outcome Date GA Lbr Len/2nd Weight Sex Delivery Anes PTL Lv  5 Current           4 Term 11/15/13 [redacted]w[redacted]d  8 lb 2 oz (3.685 kg) M CS-Vac Spinal  LIV  3 Ectopic 2013             Birth Comments: ruptured ectopic, right  2 Term 01/05/10    F CS-LTranv EPI N LIV     Birth Comments: fetal heart dropping with ctx's  1 TAB            Past Medical History:  Diagnosis Date  . BV (bacterial vaginosis)    history only  . Ectopic pregnancy   . Eczema   . Genital herpes   . Gonorrhea    history only  . Infection    chlamydia history only  . Obesity   . Ovarian cyst   . Trichimoniasis    history only  . Urinary tract infection    history only   O:  Patient Vitals for the past 24 hrs:  BP Temp Temp src Pulse Resp Height Weight  04/03/18 0835 128/85 98.9 F (37.2 C) Oral 90 18 - -  04/03/18 0824 - - - - - 5\' 6"  (1.676 m) 252 lb (114.3 kg)    General: NAD Heart: Regular rate Lungs: Normal rate and effort Abd: Soft, NT, Gravid, S=D Pelvic: NEFG, neg LOF, No blood.  Dilation:  Closed Effacement (%): Thick Cervical Position: Posterior Exam by:: v Morine Kohlman cnm  EFM: 150, Moderate variability, 10x10 accelerations, no decelerations Toco: no Contractions  Results for orders placed or performed during the hospital encounter of 04/03/18 (from the past 24 hour(s))  Urinalysis, Routine w reflex microscopic     Status: None   Collection Time: 04/03/18  8:25 AM  Result Value Ref Range   Color, Urine YELLOW YELLOW   APPearance CLEAR CLEAR   Specific Gravity, Urine 1.019 1.005 - 1.030   pH 8.0 5.0 - 8.0   Glucose, UA NEGATIVE NEGATIVE mg/dL   Hgb urine dipstick NEGATIVE NEGATIVE   Bilirubin Urine NEGATIVE NEGATIVE   Ketones, ur NEGATIVE NEGATIVE mg/dL   Protein, ur NEGATIVE NEGATIVE mg/dL   Nitrite NEGATIVE NEGATIVE   Leukocytes, UA NEGATIVE NEGATIVE  CBC with Differential/Platelet     Status: Abnormal   Collection Time: 04/03/18  9:02 AM  Result Value Ref Range   WBC 14.1 (H) 4.0 -  10.5 K/uL   RBC 4.24 3.87 - 5.11 MIL/uL   Hemoglobin 12.5 12.0 - 15.0 g/dL   HCT 16.1 09.6 - 04.5 %   MCV 86.3 78.0 - 100.0 fL   MCH 29.5 26.0 - 34.0 pg   MCHC 34.2 30.0 - 36.0 g/dL   RDW 40.9 81.1 - 91.4 %   Platelets 254 150 - 400 K/uL   Neutrophils Relative % 77 %   Neutro Abs 10.9 (H) 1.7 - 7.7 K/uL   Lymphocytes Relative 19 %   Lymphs Abs 2.6 0.7 - 4.0 K/uL   Monocytes Relative 3 %   Monocytes Absolute 0.5 0.1 - 1.0 K/uL   Eosinophils Relative 1 %   Eosinophils Absolute 0.1 0.0 - 0.7 K/uL   Basophils Relative 0 %   Basophils Absolute 0.0 0.0 - 0.1 K/uL  Comprehensive metabolic panel     Status: Abnormal   Collection Time: 04/03/18  9:02 AM  Result Value Ref Range   Sodium 133 (L) 135 - 145 mmol/L   Potassium 3.8 3.5 - 5.1 mmol/L   Chloride 105 101 - 111 mmol/L   CO2 19 (L) 22 - 32 mmol/L   Glucose, Bld 93 65 - 99 mg/dL   BUN <5 (L) 6 - 20 mg/dL   Creatinine, Ser 7.82 0.44 - 1.00 mg/dL   Calcium 8.6 (L) 8.9 - 10.3 mg/dL   Total Protein 6.7 6.5 - 8.1 g/dL   Albumin  3.1 (L) 3.5 - 5.0 g/dL   AST 21 15 - 41 U/L   ALT 25 14 - 54 U/L   Alkaline Phosphatase 59 38 - 126 U/L   Total Bilirubin 0.8 0.3 - 1.2 mg/dL   GFR calc non Af Amer >60 >60 mL/min   GFR calc Af Amer >60 >60 mL/min   Anion gap 9 5 - 15  Amylase     Status: None   Collection Time: 04/03/18  9:02 AM  Result Value Ref Range   Amylase 51 28 - 100 U/L  Lipase, blood     Status: None   Collection Time: 04/03/18  9:02 AM  Result Value Ref Range   Lipase 23 11 - 51 U/L    Orders Placed This Encounter  Procedures  . Urinalysis, Routine w reflex microscopic  . CBC with Differential/Platelet  . Comprehensive metabolic panel  . Amylase  . Lipase, blood  . Fetal fibronectin  . Discharge patient   Meds ordered this encounter  Medications  . gi cocktail (Maalox,Lidocaine,Donnatal)  . famotidine (PEPCID) 20 MG tablet    Sig: Take 1 tablet (20 mg total) by mouth 2 (two) times daily.    Dispense:  60 tablet    Refill:  3    Order Specific Question:   Supervising Provider    Answer:   Levie Heritage [[id number]]   Discussed Hx, labs, exam w/ Dr. Normand Sloop. Agrees w/ POC. Unsure of etiology of pain. Possibly from round ligament pain and from scar tissue from three previous pelvic surgeries. New orders: fFN.   Pt call out reporting numbness in throat and face. Ox sat 100 on RA. Explained that GI Cocktail has a numbing ingredient. Pt feeling fine now. States it made feel liek when she has used Chloraseptic spray in the past. Pt also reports some new dizziness. No anemia on CBC or hypokalemia on labs today. Reports that she has not eaten today. Given peanut butter crackers and juice. Feeling better.  Call from lab. fFN will have to be  sent out and may take 3 hours. Will D/C pt and F/U as needed.    A: 1545w2d week IUP LLQ likely round ligament or scar tissue-related. No evidence of PTL. Epigastric pain from GERD. No evidence of emergent abd pain.  P:Discharge home in stable condition per consult with  Jaymes Graffillard, Naima, MD. Labor/Preterm labor precautions and fetal kick counts. ES Tylenol and Heat PRN for pain.  Pepcid PRN for GERD Follow-up as scheduled for prenatal visit or sooner as needed if symptoms worsen. Return to maternity admissions as needed if symptoms worsen.  Katrinka BlazingSmith, [place], CNM 04/03/2018 9:17 AM  3

## 2018-04-03 NOTE — MAU Note (Signed)
Sharp, stabbing pain in lower left abdominal region. Sharp cramp in upper right abdomen. Both have been ongoing for the past couple weeks and just worse today. No bleeding, no vag discharge. +FM

## 2018-04-03 NOTE — Discharge Instructions (Signed)

## 2018-04-27 ENCOUNTER — Other Ambulatory Visit: Payer: Self-pay | Admitting: Obstetrics and Gynecology

## 2018-05-13 IMAGING — US US TRANSVAGINAL NON-OB
1 series · 13 of 25 positions shown · non-contrast
Comparison: None.

CLINICAL DATA: Left-sided pelvic pain for 1 week.

EXAM:
TRANSABDOMINAL AND TRANSVAGINAL ULTRASOUND OF PELVIS
DOPPLER ULTRASOUND OF OVARIES
TECHNIQUE: Both transabdominal and transvaginal ultrasound examinations of the
pelvis were performed. Transabdominal technique was performed for
global imaging of the pelvis including uterus, ovaries, adnexal
regions, and pelvic cul-de-sac.
It was necessary to proceed with endovaginal exam following the
transabdominal exam to visualize the uterus, endometrium and ovaries
to better advantage. Color and duplex Doppler ultrasound was
utilized to evaluate blood flow to the ovaries.

[Series 1: us transvaginal non-ob · 0.24mm/px · 13 of 48 slices shown]
[im 1/48]
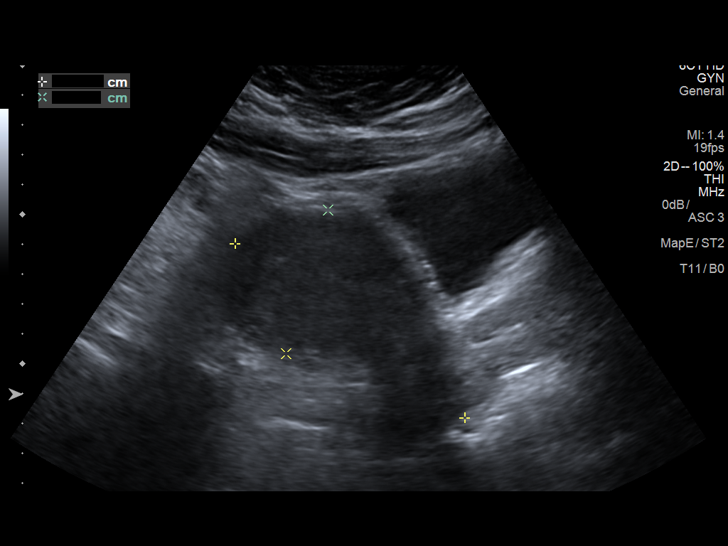
[im 4/48]
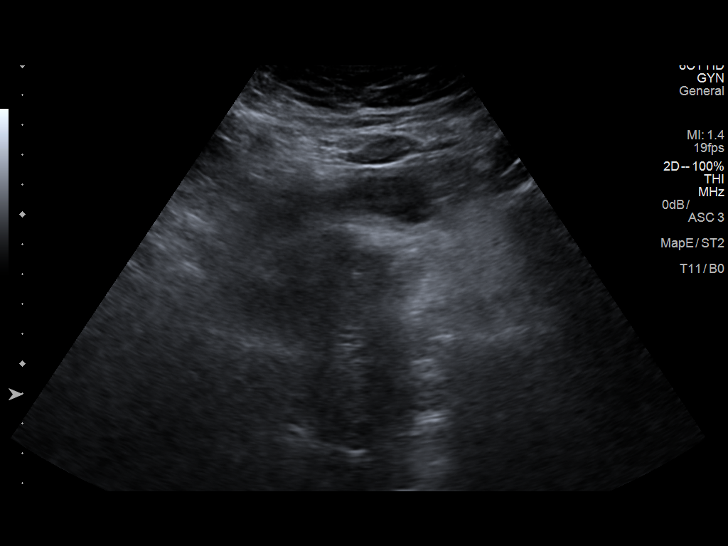
[im 8/48]
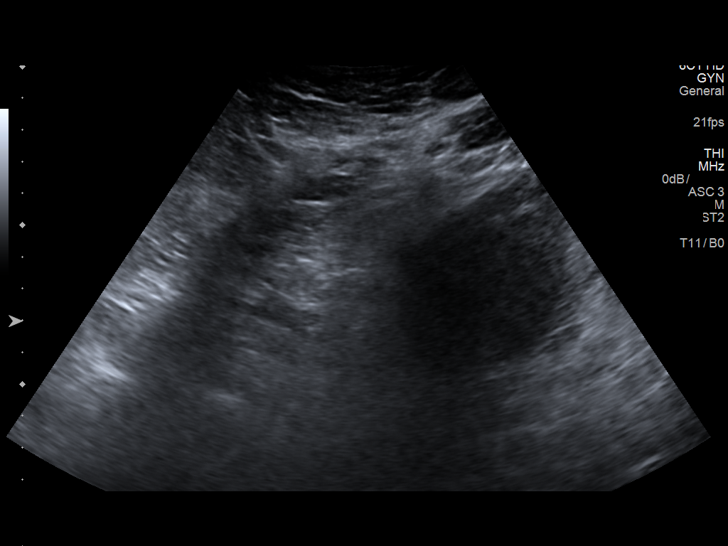
[im 12/48]
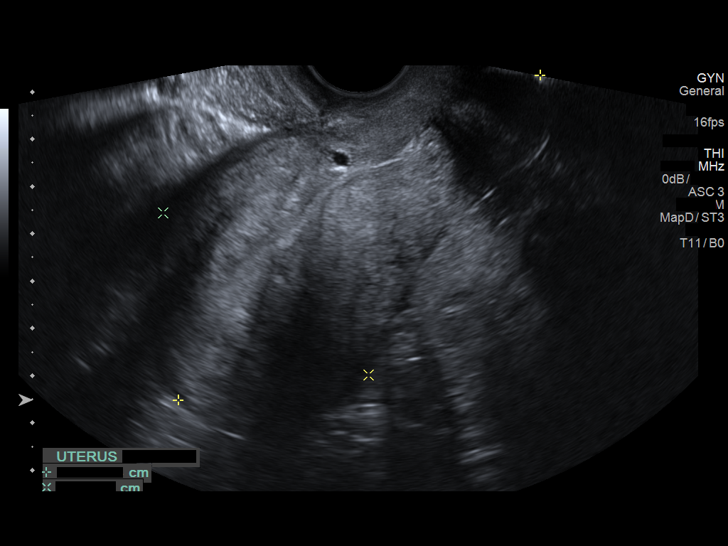
[im 16/48]
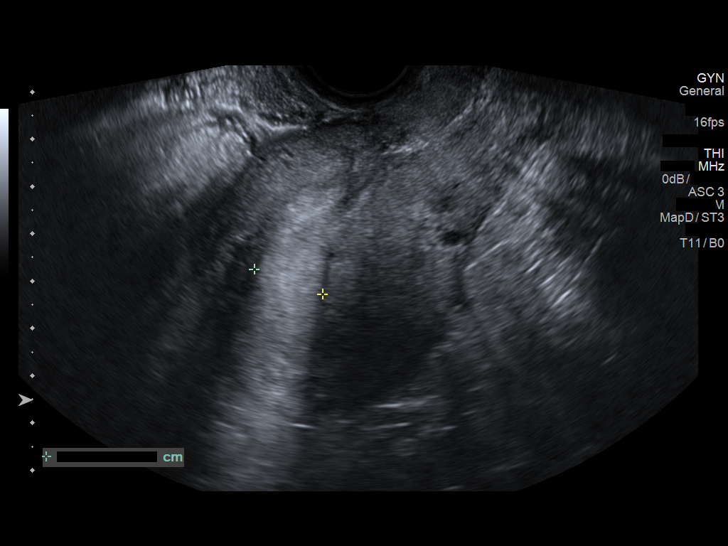
[im 20/48]
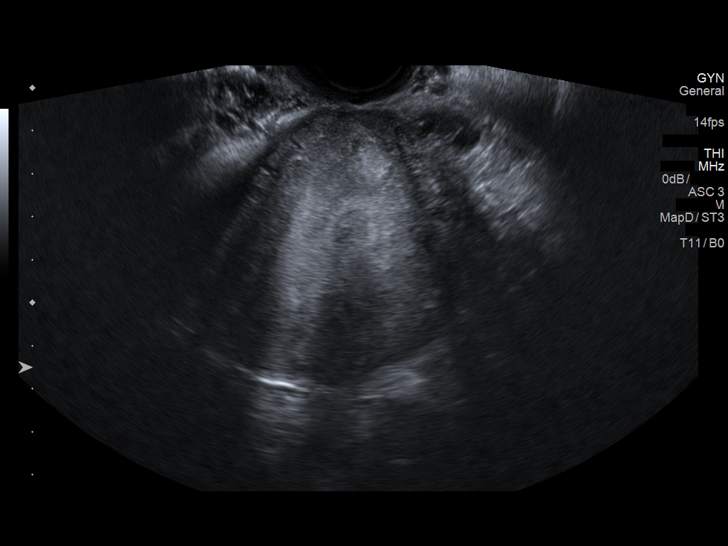
[im 24/48]
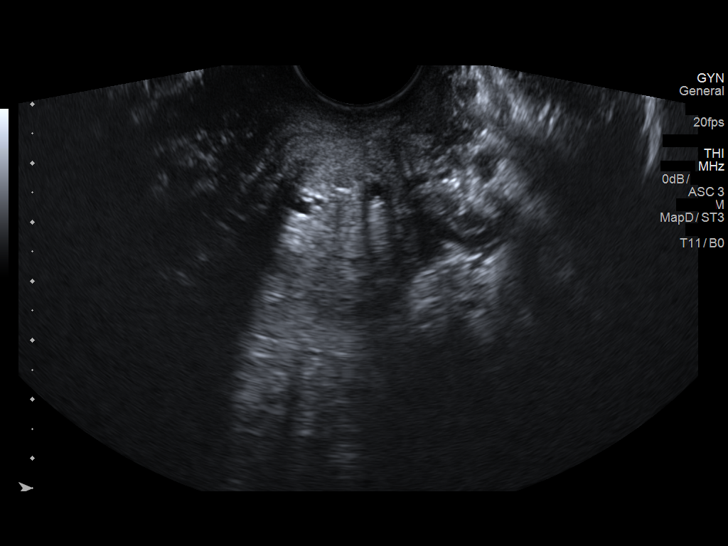
[im 28/48]
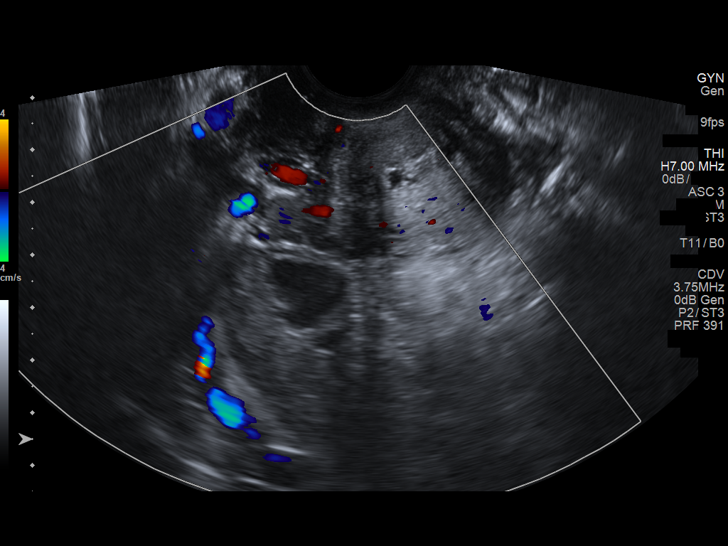
[im 32/48]
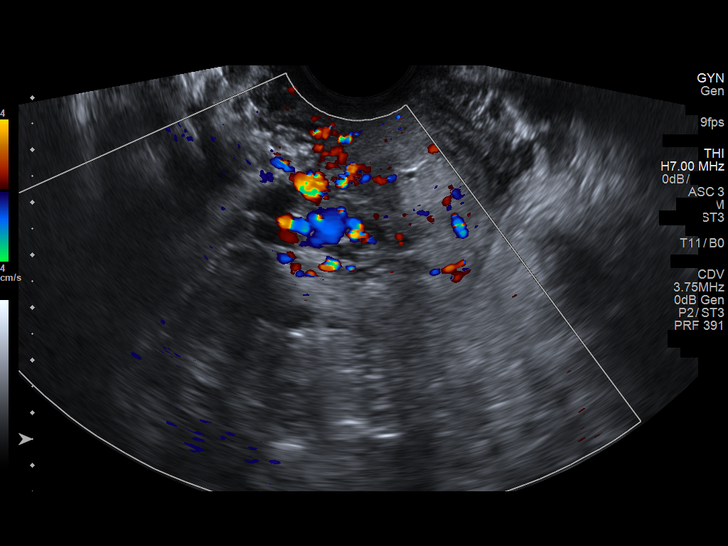
[im 36/48]
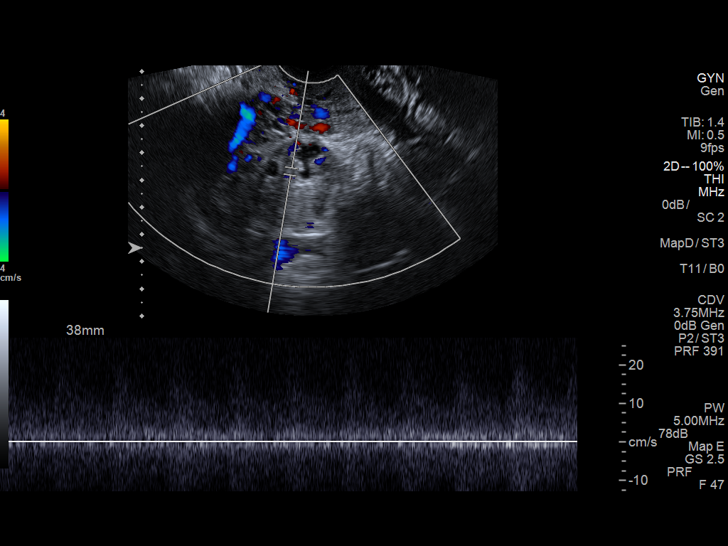
[im 40/48]
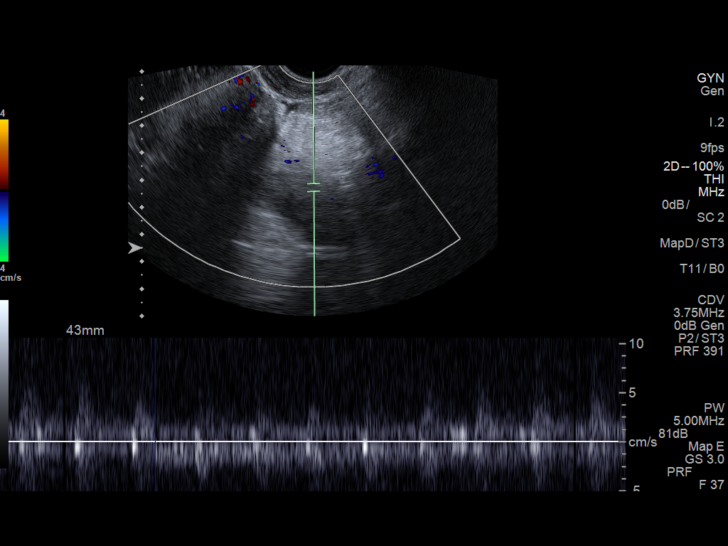
[im 44/48]
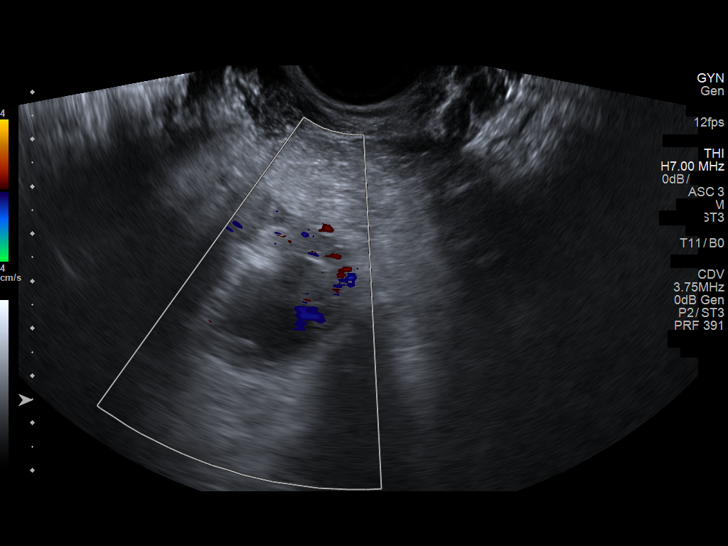
[im 48/48]
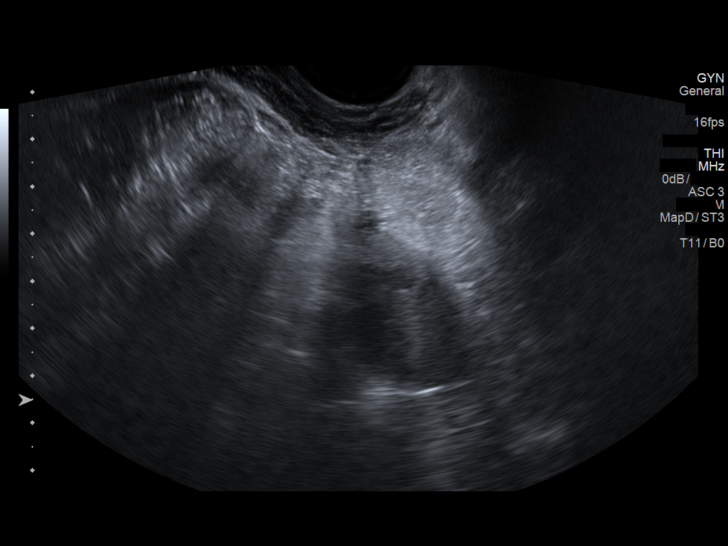

[13 of 25 positions shown; findings below may reference images not displayed]

FINDINGS: Uterus

Measurements: 10.3 x 5.5 x 5.8 cm. Small heterogeneous 1.3 cm mass
consistent with a submucosal fibroid. This lies along the left
posterior mid uterine segment. No other uterine masses. C-section
scar noted along low anterior uterus.

Endometrium

Thickness: 15 mm.  No focal abnormality visualized.

Right ovary

Measurements: 3.9 x 2.6 x 2.9 cm. Normal appearance/no adnexal mass.

Left ovary

Measurements: 3.3 x 2.5 x 3.7 cm. Normal appearance/no adnexal mass.

Pulsed Doppler evaluation of both ovaries demonstrates normal
low-resistance arterial and venous waveforms.

Other findings

No abnormal free fluid.
IMPRESSION: 1. No acute findings.  No findings to account for left pelvic pain.
2. 13 mm submucosal uterine fibroid.

## 2018-06-02 ENCOUNTER — Inpatient Hospital Stay (HOSPITAL_COMMUNITY)
Admission: AD | Admit: 2018-06-02 | Discharge: 2018-06-03 | Disposition: A | Discharge status: Home / Self Care | Payer: 59 | Source: Ambulatory Visit | Attending: Obstetrics and Gynecology | Admitting: Obstetrics and Gynecology

## 2018-06-02 ENCOUNTER — Encounter (HOSPITAL_COMMUNITY): Payer: Self-pay

## 2018-06-02 ENCOUNTER — Other Ambulatory Visit: Payer: Self-pay

## 2018-06-02 DIAGNOSIS — W108XXA Fall (on) (from) other stairs and steps, initial encounter: Secondary | ICD-10-CM

## 2018-06-02 DIAGNOSIS — O368130 Decreased fetal movements, third trimester, not applicable or unspecified: Secondary | ICD-10-CM | POA: Insufficient documentation

## 2018-06-02 DIAGNOSIS — W109XXA Fall (on) (from) unspecified stairs and steps, initial encounter: Secondary | ICD-10-CM | POA: Insufficient documentation

## 2018-06-02 DIAGNOSIS — O99213 Obesity complicating pregnancy, third trimester: Secondary | ICD-10-CM | POA: Insufficient documentation

## 2018-06-02 DIAGNOSIS — Z3A35 35 weeks gestation of pregnancy: Secondary | ICD-10-CM | POA: Diagnosis not present

## 2018-06-02 DIAGNOSIS — E669 Obesity, unspecified: Secondary | ICD-10-CM | POA: Insufficient documentation

## 2018-06-02 LAB — CBC
HCT: 36.5 % (ref 36.0–46.0)
HEMOGLOBIN: 11.8 g/dL — AB (ref 12.0–15.0)
MCH: 27.5 pg (ref 26.0–34.0)
MCHC: 32.3 g/dL (ref 30.0–36.0)
MCV: 85.1 fL (ref 78.0–100.0)
Platelets: 241 10*3/uL (ref 150–400)
RBC: 4.29 MIL/uL (ref 3.87–5.11)
RDW: 15.4 % (ref 11.5–15.5)
WBC: 14.4 10*3/uL — ABNORMAL HIGH (ref 4.0–10.5)

## 2018-06-02 LAB — PROTEIN / CREATININE RATIO, URINE
Creatinine, Urine: 301 mg/dL
Protein Creatinine Ratio: 0.03 mg/mg{Cre} (ref 0.00–0.15)
TOTAL PROTEIN, URINE: 9 mg/dL

## 2018-06-02 NOTE — MAU Note (Signed)
Pt. States she fell around 12pm today coming down the stairs. States she missed the last 2-3 steps and fell on side. Noticed no fetal movement around 9:30pm. Denies vaginal bleeding and discharge.

## 2018-06-03 DIAGNOSIS — O368130 Decreased fetal movements, third trimester, not applicable or unspecified: Secondary | ICD-10-CM | POA: Diagnosis not present

## 2018-06-03 LAB — COMPREHENSIVE METABOLIC PANEL
ALK PHOS: 100 U/L (ref 38–126)
ALT: 16 U/L (ref 14–54)
ANION GAP: 10 (ref 5–15)
AST: 20 U/L (ref 15–41)
Albumin: 2.9 g/dL — ABNORMAL LOW (ref 3.5–5.0)
BUN: 8 mg/dL (ref 6–20)
CALCIUM: 9 mg/dL (ref 8.9–10.3)
CO2: 19 mmol/L — AB (ref 22–32)
Chloride: 105 mmol/L (ref 101–111)
Creatinine, Ser: 0.54 mg/dL (ref 0.44–1.00)
GFR calc non Af Amer: >60 mL/min (ref >60)
Glucose, Bld: 139 mg/dL — ABNORMAL HIGH (ref 65–99)
Potassium: 3.8 mmol/L (ref 3.5–5.1)
SODIUM: 134 mmol/L — AB (ref 135–145)
TOTAL PROTEIN: 6.2 g/dL — AB (ref 6.5–8.1)
Total Bilirubin: 0.5 mg/dL (ref 0.3–1.2)

## 2018-06-03 NOTE — MAU Provider Note (Signed)
History     CSN: [id number]  Arrival date and time: 06/02/18 2213   None     Chief Complaint  Patient presents with  . Fall   HPI  Kathy Hill came to MAU with c/o decreaed fetal movement. She said she fell down the stairs earlier today around 1200p. Was walking down steps and missed the last 3 steps. She twisted to not fall on abdomen and landed on her left hip. No abd trauma. Had no ctx. Has had no bleeding. This pm, she noted that she was not feeling baby move. She came here for eval. No contractions.   Pregnancy followed for obesity - having twice weekly testing that has been normal, depression - rx wellbutrin, prior c/s - desires TOLAC.  OB History    Gravida  5   Para  2   Term  2   Preterm  0   AB  2   Living  2     SAB  0   TAB  1   Ectopic  1   Multiple  0   Live Births  2           Past Medical History:  Diagnosis Date  . BV (bacterial vaginosis)    history only  . Ectopic pregnancy   . Eczema   . Genital herpes   . Gonorrhea    history only  . Infection    chlamydia history only  . Obesity   . Ovarian cyst   . Trichimoniasis    history only  . Urinary tract infection    history only    Past Surgical History:  Procedure Laterality Date  . CESAREAN SECTION  2011   [facility]  . CESAREAN SECTION N/A 11/15/2013   Procedure: REPEAT CESAREAN SECTION;  Surgeon: Kirkland Hun, MD;  Location: [facility];  Service: Obstetrics;  Laterality: N/A;  . LAPAROSCOPY  02/18/2012   Procedure: LAPAROSCOPY OPERATIVE;  Surgeon: Catalina Antigua, MD;  Location: [facility];  Service: Gynecology;  Laterality: N/A;  Lysis of Adhesions, Operative laparoscopy for ectopic pregnancy, left salpingectomy,   . THERAPEUTIC ABORTION    . UNILATERAL SALPINGECTOMY     right    Family History  Problem Relation Age of Onset  . Thyroid disease Mother   . Kidney disease Father        on dylasis  . Hypertension Father   . Heart disease Father   . Breast cancer Paternal Grandmother   .  Diabetes Paternal Grandmother        type 2  . Colon polyps Unknown        pat side  . Anesthesia problems Neg Hx   . Hypotension Neg Hx   . Malignant hyperthermia Neg Hx   . Pseudochol deficiency Neg Hx   . Other Neg Hx     Social History   Tobacco Use  . Smoking status: Never Smoker  . Smokeless tobacco: Never Used  Substance Use Topics  . Alcohol use: No  . Drug use: No    Allergies: No Known Allergies  Medications Prior to Admission  Medication Sig Dispense Refill Last Dose  . diphenhydramine-acetaminophen (TYLENOL PM) 25-500 MG TABS tablet Take 1 tablet by mouth at bedtime as needed.   Past Week at Unknown time  . Prenatal Vit-Fe Fumarate-FA (PRENATAL 1+1 PO) Take 1 tablet by mouth daily.   06/02/2018 at Unknown time  . buPROPion (WELLBUTRIN XL) 150 MG 24 hr tablet Take 1 tablet by mouth daily.  More than a month at Unknown time  . famotidine (PEPCID) 20 MG tablet Take 1 tablet (20 mg total) by mouth 2 (two) times daily. 60 tablet 3 More than a month at Unknown time    Review of Systems  Constitutional: Negative.   Respiratory: Negative.   Cardiovascular: Negative.   Gastrointestinal: Negative.   Genitourinary: Negative.   Musculoskeletal: Negative.   Skin: Negative.   Neurological: Negative.   Psychiatric/Behavioral: Negative.    Physical Exam   Blood pressure 139/88, pulse (!) 105, temperature 99 F (37.2 C), temperature source Oral, resp. rate 18, height 5\' 6"  (1.676 m), weight 119.3 kg (263 lb), last menstrual period 08/28/2017, SpO2 100 %.   Vitals:   06/02/18 2229 06/02/18 2236  BP: (!) 137/91 139/88  Pulse: (!) 106 (!) 105  Resp: 18 18  Temp: 99 F (37.2 C)   SpO2: 100%      Physical Exam  Nursing note and vitals reviewed. Constitutional: She is oriented to person, place, and time. She appears well-developed and well-nourished.  HENT:  Head: Normocephalic and atraumatic.  Cardiovascular:  No edema BLE  GI: Soft.  Genitourinary: Uterus  normal.  Genitourinary Comments: Bladder nontender suprapubic. Neg flank pain. Uterus soft. Mild tenderness referred to right side where fetus is positioned when I palpate on the lower left side.   Musculoskeletal:  No pubic symphysis pain or diastasis noted  Neurological: She is alert and oriented to person, place, and time.  Skin: Skin is warm and dry.  Psychiatric: She has a normal mood and affect. Her behavior is normal. Judgment and thought content normal.    MAU Course  Procedures  MDM  Elevated blood pressure - AST/ALT/creat/plt/UPCR normal. Repeat BP all normal.  Fetus A Non-Stress Test Interpretation for 06/03/18  Indication: Decreased Fetal Movement  Fetal Heart Rate A Mode: External Baseline Rate (A): 155 bpm Variability: Moderate Accelerations: 15 x 15 Decelerations: None  Uterine Activity Mode: Toco Contraction Frequency (min): No UCs     Assessment and Plan  1) 35 wks s/p fall down stairs without abd/uterine trauma 10 hours prior to presentation. No s/s labor. No bleeding. No uterine activity. - stable for d/c. US deferred based on clinical exam. Reviewed warning signs of labor and abruption. F/u as scheduled in office. Having twice weekly testing r/t obesity. 2) decreased fetal movement - resolved. Reactive NST. No decels. Reassurance given. 3) elevated BP once - no s/s pre-eclampsia. Labs negative.   D/c home.    Kathy Hill 06/03/2018, 12:20 AM

## 2018-06-07 ENCOUNTER — Encounter (HOSPITAL_COMMUNITY): Payer: Self-pay

## 2018-06-08 ENCOUNTER — Encounter (HOSPITAL_COMMUNITY): Payer: Self-pay

## 2018-06-20 NOTE — Patient Instructions (Signed)
[place]  06/20/2018   Your procedure is scheduled on:  06/25/2018  [facility] at 0730 AM.  Pick up the phone at the desk and dial [phone]  Call this number if you have problems the morning of [phone]  Remember:   Do not eat food:(After Midnight) Desps de medianoche.  Do not drink clear liquids: (After Midnight) Desps de medianoche.  Take these medicines the morning of surgery with A SIP OF WATER: valtrex   Do not wear jewelry, make-up or nail polish.  Do not wear lotions, powders, or perfumes. Do not wear deodorant.  Do not shave 48 hours prior to surgery.  Do not bring valuables to the hospital.  [facility] is not   responsible for any belongings or valuables brought to the hospital.  Contacts, dentures or bridgework may not be worn into surgery.  Leave suitcase in the car. After surgery it may be brought to your room.  For patients admitted to the hospital, checkout time is 11:00 AM the day of              discharge.    N/A   Please read over the following fact sheets that you were given:   Surgical Site Infection Prevention

## 2018-06-22 ENCOUNTER — Encounter (HOSPITAL_COMMUNITY): Payer: Self-pay

## 2018-06-22 ENCOUNTER — Encounter (HOSPITAL_COMMUNITY)
Admission: RE | Admit: 2018-06-22 | Discharge: 2018-06-22 | Disposition: A | Discharge status: Home / Self Care | Payer: 59 | Source: Ambulatory Visit | Attending: Obstetrics and Gynecology | Admitting: Obstetrics and Gynecology

## 2018-06-22 HISTORY — DX: Anogenital herpesviral infection, unspecified: A60.9

## 2018-06-22 LAB — CBC
HCT: 37.3 % (ref 36.0–46.0)
Hemoglobin: 11.9 g/dL — ABNORMAL LOW (ref 12.0–15.0)
MCH: 26.7 pg (ref 26.0–34.0)
MCHC: 31.9 g/dL (ref 30.0–36.0)
MCV: 83.8 fL (ref 78.0–100.0)
PLATELETS: 208 10*3/uL (ref 150–400)
RBC: 4.45 MIL/uL (ref 3.87–5.11)
RDW: 15.7 % — AB (ref 11.5–15.5)
WBC: 12.2 10*3/uL — ABNORMAL HIGH (ref 4.0–10.5)

## 2018-06-22 LAB — TYPE AND SCREEN
ABO/RH(D): O POS
Antibody Screen: NEGATIVE

## 2018-06-23 LAB — RPR: RPR: NONREACTIVE

## 2018-06-25 ENCOUNTER — Encounter (HOSPITAL_COMMUNITY)
Admission: AD | Disposition: A | Discharge status: Home / Self Care | Payer: Self-pay | Source: Home / Self Care | Attending: Obstetrics and Gynecology

## 2018-06-25 ENCOUNTER — Inpatient Hospital Stay (HOSPITAL_COMMUNITY): Payer: 59 | Admitting: Anesthesiology

## 2018-06-25 ENCOUNTER — Encounter (HOSPITAL_COMMUNITY): Payer: Self-pay | Admitting: *Deleted

## 2018-06-25 ENCOUNTER — Inpatient Hospital Stay (HOSPITAL_COMMUNITY)
Admission: AD | Admit: 2018-06-25 | Discharge: 2018-06-28 | DRG: 787 | Disposition: A | Discharge status: Home / Self Care | Payer: 59 | Attending: Obstetrics and Gynecology | Admitting: Obstetrics and Gynecology

## 2018-06-25 DIAGNOSIS — Z98891 History of uterine scar from previous surgery: Secondary | ICD-10-CM

## 2018-06-25 DIAGNOSIS — D649 Anemia, unspecified: Secondary | ICD-10-CM | POA: Diagnosis present

## 2018-06-25 DIAGNOSIS — O139 Gestational [pregnancy-induced] hypertension without significant proteinuria, unspecified trimester: Secondary | ICD-10-CM

## 2018-06-25 DIAGNOSIS — O99214 Obesity complicating childbirth: Secondary | ICD-10-CM | POA: Diagnosis present

## 2018-06-25 DIAGNOSIS — O34211 Maternal care for low transverse scar from previous cesarean delivery: Principal | ICD-10-CM | POA: Diagnosis present

## 2018-06-25 DIAGNOSIS — Z3A39 39 weeks gestation of pregnancy: Secondary | ICD-10-CM | POA: Diagnosis not present

## 2018-06-25 DIAGNOSIS — K219 Gastro-esophageal reflux disease without esophagitis: Secondary | ICD-10-CM | POA: Diagnosis present

## 2018-06-25 DIAGNOSIS — O9832 Other infections with a predominantly sexual mode of transmission complicating childbirth: Secondary | ICD-10-CM | POA: Diagnosis present

## 2018-06-25 DIAGNOSIS — O9962 Diseases of the digestive system complicating childbirth: Secondary | ICD-10-CM | POA: Diagnosis present

## 2018-06-25 DIAGNOSIS — A6000 Herpesviral infection of urogenital system, unspecified: Secondary | ICD-10-CM | POA: Diagnosis present

## 2018-06-25 DIAGNOSIS — O134 Gestational [pregnancy-induced] hypertension without significant proteinuria, complicating childbirth: Secondary | ICD-10-CM | POA: Diagnosis present

## 2018-06-25 DIAGNOSIS — O9902 Anemia complicating childbirth: Secondary | ICD-10-CM | POA: Diagnosis present

## 2018-06-25 HISTORY — DX: Depression, unspecified: F32.A

## 2018-06-25 HISTORY — DX: Major depressive disorder, single episode, unspecified: F32.9

## 2018-06-25 LAB — COMPREHENSIVE METABOLIC PANEL WITH GFR
ALT: 16 U/L (ref 0–44)
AST: 27 U/L (ref 15–41)
Albumin: 3 g/dL — ABNORMAL LOW (ref 3.5–5.0)
Alkaline Phosphatase: 117 U/L (ref 38–126)
Anion gap: 9 (ref 5–15)
BUN: 9 mg/dL (ref 6–20)
CO2: 22 mmol/L (ref 22–32)
Calcium: 8.6 mg/dL — ABNORMAL LOW (ref 8.9–10.3)
Chloride: 104 mmol/L (ref 98–111)
Creatinine, Ser: 0.76 mg/dL (ref 0.44–1.00)
GFR calc Af Amer: >60 mL/min (ref >60)
GFR calc non Af Amer: >60 mL/min (ref >60)
Glucose, Bld: 96 mg/dL (ref 70–99)
Potassium: 4.3 mmol/L (ref 3.5–5.1)
Sodium: 135 mmol/L (ref 135–145)
Total Bilirubin: 0.7 mg/dL (ref 0.3–1.2)
Total Protein: 6.2 g/dL — ABNORMAL LOW (ref 6.5–8.1)

## 2018-06-25 LAB — CBC
HCT: 36 % (ref 36.0–46.0)
HCT: 32.6 % — ABNORMAL LOW (ref 36.0–46.0)
HCT: 28.8 % — ABNORMAL LOW (ref 36.0–46.0)
HEMOGLOBIN: 11.5 g/dL — AB (ref 12.0–15.0)
HEMOGLOBIN: 9.4 g/dL — AB (ref 12.0–15.0)
Hemoglobin: 10.4 g/dL — ABNORMAL LOW (ref 12.0–15.0)
MCH: 27.1 pg (ref 26.0–34.0)
MCH: 26.9 pg (ref 26.0–34.0)
MCH: 27.6 pg (ref 26.0–34.0)
MCHC: 31.9 g/dL (ref 30.0–36.0)
MCHC: 31.9 g/dL (ref 30.0–36.0)
MCHC: 32.6 g/dL (ref 30.0–36.0)
MCV: 84.7 fL (ref 78.0–100.0)
MCV: 84.5 fL (ref 78.0–100.0)
MCV: 84.5 fL (ref 78.0–100.0)
PLATELETS: 235 10*3/uL (ref 150–400)
Platelets: 212 10*3/uL (ref 150–400)
Platelets: 229 10*3/uL (ref 150–400)
RBC: 4.25 MIL/uL (ref 3.87–5.11)
RBC: 3.86 MIL/uL — ABNORMAL LOW (ref 3.87–5.11)
RBC: 3.41 MIL/uL — AB (ref 3.87–5.11)
RDW: 16.1 % — ABNORMAL HIGH (ref 11.5–15.5)
RDW: 16.2 % — ABNORMAL HIGH (ref 11.5–15.5)
RDW: 16.1 % — AB (ref 11.5–15.5)
WBC: 12 10*3/uL — ABNORMAL HIGH (ref 4.0–10.5)
WBC: 21.8 10*3/uL — AB (ref 4.0–10.5)
WBC: 23.4 10*3/uL — AB (ref 4.0–10.5)

## 2018-06-25 LAB — TYPE AND SCREEN
ABO/RH(D): O POS
Antibody Screen: NEGATIVE

## 2018-06-25 LAB — PROTEIN / CREATININE RATIO, URINE
Creatinine, Urine: 354 mg/dL
Protein Creatinine Ratio: 0.18 mg/mg{creat} — ABNORMAL HIGH (ref 0.00–0.15)
Total Protein, Urine: 62 mg/dL

## 2018-06-25 SURGERY — Surgical Case
Anesthesia: Spinal | Wound class: Clean Contaminated

## 2018-06-25 MED ORDER — MEPERIDINE HCL 25 MG/ML IJ SOLN: 6.2500 mg | INTRAMUSCULAR | Status: DC | PRN

## 2018-06-25 MED ORDER — OXYCODONE-ACETAMINOPHEN 5-325 MG PO TABS
1.0000 | ORAL_TABLET | ORAL | Status: DC | PRN
  Administered 2018-06-26 – 2018-06-27 (×4): 1 via ORAL
  Filled 2018-06-25 (×4): qty 1

## 2018-06-25 MED ORDER — CARBOPROST TROMETHAMINE 250 MCG/ML IM SOLN
250.0000 ug | INTRAMUSCULAR | Status: AC
  Administered 2018-06-25: 250 ug via INTRAMUSCULAR

## 2018-06-25 MED ORDER — LABETALOL HCL 5 MG/ML IV SOLN
INTRAVENOUS | Status: AC
  Filled 2018-06-25: qty 4

## 2018-06-25 MED ORDER — TRANEXAMIC ACID 650 MG PO TABS
1300.0000 mg | ORAL_TABLET | Freq: Once | ORAL | Status: AC
  Administered 2018-06-25: 1300 mg via ORAL
  Filled 2018-06-25: qty 2

## 2018-06-25 MED ORDER — GLYCOPYRROLATE 0.2 MG/ML IJ SOLN
INTRAMUSCULAR | Status: DC | PRN
  Administered 2018-06-25: .2 mg via INTRAVENOUS

## 2018-06-25 MED ORDER — SIMETHICONE 80 MG PO CHEW
80.0000 mg | CHEWABLE_TABLET | Freq: Three times a day (TID) | ORAL | Status: DC
Start: 2018-06-25 — End: 2018-06-28
  Administered 2018-06-26 – 2018-06-28 (×6): 80 mg via ORAL
  Filled 2018-06-25 (×6): qty 1

## 2018-06-25 MED ORDER — OXYCODONE-ACETAMINOPHEN 5-325 MG PO TABS
2.0000 | ORAL_TABLET | ORAL | Status: DC | PRN
  Administered 2018-06-27 – 2018-06-28 (×3): 2 via ORAL
  Filled 2018-06-25 (×3): qty 2

## 2018-06-25 MED ORDER — SCOPOLAMINE 1 MG/3DAYS TD PT72: 1.0000 | MEDICATED_PATCH | Freq: Once | TRANSDERMAL | Status: DC

## 2018-06-25 MED ORDER — CARBOPROST TROMETHAMINE 250 MCG/ML IM SOLN: 250.0000 ug | Freq: Once | INTRAMUSCULAR | Status: DC

## 2018-06-25 MED ORDER — LABETALOL HCL 5 MG/ML IV SOLN
10.0000 mg | Freq: Once | INTRAVENOUS | Status: AC
Start: 2018-06-25 — End: 2018-06-25
  Administered 2018-06-25: 10 mg via INTRAVENOUS

## 2018-06-25 MED ORDER — STERILE WATER FOR IRRIGATION IR SOLN
Status: DC | PRN
  Administered 2018-06-25: 1000 mL

## 2018-06-25 MED ORDER — ONDANSETRON HCL 4 MG/2ML IJ SOLN
INTRAMUSCULAR | Status: AC
Start: 2018-06-25 — End: ?
  Filled 2018-06-25: qty 2

## 2018-06-25 MED ORDER — MORPHINE SULFATE (PF) 0.5 MG/ML IJ SOLN
INTRAMUSCULAR | Status: DC | PRN
  Administered 2018-06-25: .2 mg via INTRATHECAL

## 2018-06-25 MED ORDER — TETANUS-DIPHTH-ACELL PERTUSSIS 5-2.5-18.5 LF-MCG/0.5 IM SUSP: 0.5000 mL | Freq: Once | INTRAMUSCULAR | Status: DC

## 2018-06-25 MED ORDER — DEXAMETHASONE SODIUM PHOSPHATE 10 MG/ML IJ SOLN
INTRAMUSCULAR | Status: AC
Start: 2018-06-25 — End: ?
  Filled 2018-06-25: qty 1

## 2018-06-25 MED ORDER — LACTATED RINGERS IV SOLN
INTRAVENOUS | Status: DC
  Administered 2018-06-25 – 2018-06-26 (×2): via INTRAVENOUS

## 2018-06-25 MED ORDER — WITCH HAZEL-GLYCERIN EX PADS: 1.0000 "application " | MEDICATED_PAD | CUTANEOUS | Status: DC | PRN

## 2018-06-25 MED ORDER — EPHEDRINE SULFATE 50 MG/ML IJ SOLN
INTRAMUSCULAR | Status: DC | PRN
  Administered 2018-06-25 (×2): 10 mg via INTRAVENOUS

## 2018-06-25 MED ORDER — NALBUPHINE HCL 10 MG/ML IJ SOLN
INTRAMUSCULAR | Status: AC
  Filled 2018-06-25: qty 1

## 2018-06-25 MED ORDER — ACETAMINOPHEN 325 MG PO TABS: 650.0000 mg | ORAL_TABLET | ORAL | Status: DC | PRN

## 2018-06-25 MED ORDER — SCOPOLAMINE 1 MG/3DAYS TD PT72
MEDICATED_PATCH | TRANSDERMAL | Status: AC
  Administered 2018-06-25: 1.5 mg
  Filled 2018-06-25: qty 1

## 2018-06-25 MED ORDER — IBUPROFEN 600 MG PO TABS
600.0000 mg | ORAL_TABLET | Freq: Four times a day (QID) | ORAL | Status: DC
  Administered 2018-06-25 – 2018-06-28 (×10): 600 mg via ORAL
  Filled 2018-06-25 (×10): qty 1

## 2018-06-25 MED ORDER — OXYTOCIN 40 UNITS IN LACTATED RINGERS INFUSION - SIMPLE MED
INTRAVENOUS | Status: AC
  Administered 2018-06-25: 13:00:00
  Filled 2018-06-25: qty 1000

## 2018-06-25 MED ORDER — CARBOPROST TROMETHAMINE 250 MCG/ML IM SOLN
INTRAMUSCULAR | Status: AC
  Filled 2018-06-25: qty 1

## 2018-06-25 MED ORDER — VALACYCLOVIR HCL 500 MG PO TABS
500.0000 mg | ORAL_TABLET | Freq: Every day | ORAL | Status: DC
  Administered 2018-06-26 – 2018-06-28 (×3): 500 mg via ORAL
  Filled 2018-06-25 (×3): qty 1

## 2018-06-25 MED ORDER — SENNOSIDES-DOCUSATE SODIUM 8.6-50 MG PO TABS
2.0000 | ORAL_TABLET | ORAL | Status: DC
  Administered 2018-06-25 – 2018-06-27 (×3): 2 via ORAL
  Filled 2018-06-25 (×3): qty 2

## 2018-06-25 MED ORDER — ONDANSETRON HCL 4 MG/2ML IJ SOLN
INTRAMUSCULAR | Status: DC | PRN
  Administered 2018-06-25: 4 mg via INTRAVENOUS

## 2018-06-25 MED ORDER — SOD CITRATE-CITRIC ACID 500-334 MG/5ML PO SOLN: 30.0000 mL | Freq: Once | ORAL | Status: DC

## 2018-06-25 MED ORDER — COCONUT OIL OIL
1.0000 "application " | TOPICAL_OIL | Status: DC | PRN
  Administered 2018-06-27: 1 via TOPICAL
  Filled 2018-06-25: qty 120

## 2018-06-25 MED ORDER — KETOROLAC TROMETHAMINE 30 MG/ML IJ SOLN: 30.0000 mg | Freq: Once | INTRAMUSCULAR | Status: DC | PRN

## 2018-06-25 MED ORDER — PHENYLEPHRINE 8 MG IN D5W 100 ML (0.08MG/ML) PREMIX OPTIME
INJECTION | INTRAVENOUS | Status: AC
  Filled 2018-06-25: qty 100

## 2018-06-25 MED ORDER — EPHEDRINE 5 MG/ML INJ
INTRAVENOUS | Status: AC
Start: 2018-06-25 — End: ?
  Filled 2018-06-25: qty 10

## 2018-06-25 MED ORDER — TRANEXAMIC ACID 1000 MG/10ML IV SOLN
1000.0000 mg | INTRAVENOUS | Status: AC
  Administered 2018-06-25: 1000 mg via INTRAVENOUS
  Filled 2018-06-25: qty 1100

## 2018-06-25 MED ORDER — PRENATAL MULTIVITAMIN CH
1.0000 | ORAL_TABLET | Freq: Every day | ORAL | Status: DC
  Administered 2018-06-26 – 2018-06-27 (×2): 1 via ORAL
  Filled 2018-06-25 (×2): qty 1

## 2018-06-25 MED ORDER — LOPERAMIDE HCL 2 MG PO CAPS
4.0000 mg | ORAL_CAPSULE | ORAL | Status: AC
  Administered 2018-06-25: 4 mg via ORAL
  Filled 2018-06-25: qty 2

## 2018-06-25 MED ORDER — MIDAZOLAM HCL 2 MG/2ML IJ SOLN: 0.5000 mg | Freq: Once | INTRAMUSCULAR | Status: DC | PRN

## 2018-06-25 MED ORDER — MENTHOL 3 MG MT LOZG: 1.0000 | LOZENGE | OROMUCOSAL | Status: DC | PRN

## 2018-06-25 MED ORDER — BUPIVACAINE IN DEXTROSE 0.75-8.25 % IT SOLN
INTRATHECAL | Status: DC | PRN
  Administered 2018-06-25: 1.4 mL via INTRATHECAL

## 2018-06-25 MED ORDER — SIMETHICONE 80 MG PO CHEW: 80.0000 mg | CHEWABLE_TABLET | ORAL | Status: DC | PRN

## 2018-06-25 MED ORDER — DIPHENHYDRAMINE HCL 50 MG/ML IJ SOLN
12.5000 mg | Freq: Once | INTRAMUSCULAR | Status: AC
Start: 2018-06-25 — End: 2018-06-25
  Administered 2018-06-25: 12.5 mg via INTRAVENOUS

## 2018-06-25 MED ORDER — OXYTOCIN 10 UNIT/ML IJ SOLN
INTRAMUSCULAR | Status: AC
  Filled 2018-06-25: qty 4

## 2018-06-25 MED ORDER — OXYTOCIN 10 UNIT/ML IJ SOLN
INTRAVENOUS | Status: DC | PRN
  Administered 2018-06-25: 40 [IU] via INTRAVENOUS

## 2018-06-25 MED ORDER — FENTANYL CITRATE (PF) 100 MCG/2ML IJ SOLN
INTRAMUSCULAR | Status: DC | PRN
  Administered 2018-06-25: 10 ug via INTRATHECAL

## 2018-06-25 MED ORDER — MISOPROSTOL 200 MCG PO TABS
ORAL_TABLET | ORAL | Status: AC
  Filled 2018-06-25: qty 5

## 2018-06-25 MED ORDER — MISOPROSTOL 200 MCG PO TABS
1000.0000 ug | ORAL_TABLET | Freq: Once | ORAL | Status: AC
  Administered 2018-06-25: 1000 ug via RECTAL

## 2018-06-25 MED ORDER — PROMETHAZINE HCL 25 MG/ML IJ SOLN: 6.2500 mg | INTRAMUSCULAR | Status: DC | PRN

## 2018-06-25 MED ORDER — MORPHINE SULFATE (PF) 0.5 MG/ML IJ SOLN
INTRAMUSCULAR | Status: AC
  Filled 2018-06-25: qty 10

## 2018-06-25 MED ORDER — SODIUM CHLORIDE 0.9 % IV SOLN
3.0000 g | Freq: Four times a day (QID) | INTRAVENOUS | Status: AC
  Administered 2018-06-25 – 2018-06-26 (×4): 3 g via INTRAVENOUS
  Filled 2018-06-25 (×4): qty 3

## 2018-06-25 MED ORDER — NALBUPHINE HCL 10 MG/ML IJ SOLN
5.0000 mg | INTRAMUSCULAR | Status: DC | PRN
  Administered 2018-06-25: 5 mg via INTRAVENOUS

## 2018-06-25 MED ORDER — LACTATED RINGERS IV SOLN
INTRAVENOUS | Status: DC
  Administered 2018-06-25 (×2): via INTRAVENOUS

## 2018-06-25 MED ORDER — DEXAMETHASONE SODIUM PHOSPHATE 10 MG/ML IJ SOLN
INTRAMUSCULAR | Status: DC | PRN
  Administered 2018-06-25: 10 mg via INTRAVENOUS

## 2018-06-25 MED ORDER — SODIUM CHLORIDE 0.9 % IR SOLN
Status: DC | PRN
  Administered 2018-06-25: 1000 mL

## 2018-06-25 MED ORDER — DIPHENHYDRAMINE HCL 50 MG/ML IJ SOLN
INTRAMUSCULAR | Status: AC
  Filled 2018-06-25: qty 1

## 2018-06-25 MED ORDER — OXYTOCIN 40 UNITS IN LACTATED RINGERS INFUSION - SIMPLE MED
INTRAVENOUS | Status: DC | PRN
  Administered 2018-06-25: 100 mL via INTRAVENOUS
  Administered 2018-06-25: 800 mL via INTRAVENOUS

## 2018-06-25 MED ORDER — SIMETHICONE 80 MG PO CHEW
80.0000 mg | CHEWABLE_TABLET | ORAL | Status: DC
  Administered 2018-06-25 – 2018-06-27 (×3): 80 mg via ORAL
  Filled 2018-06-25 (×3): qty 1

## 2018-06-25 MED ORDER — ZOLPIDEM TARTRATE 5 MG PO TABS: 5.0000 mg | ORAL_TABLET | Freq: Every evening | ORAL | Status: DC | PRN

## 2018-06-25 MED ORDER — MORPHINE SULFATE (PF) 4 MG/ML IV SOLN: 1.0000 mg | INTRAVENOUS | Status: DC | PRN

## 2018-06-25 MED ORDER — OXYTOCIN 40 UNITS IN LACTATED RINGERS INFUSION - SIMPLE MED: 2.5000 [IU]/h | INTRAVENOUS | Status: DC

## 2018-06-25 MED ORDER — FENTANYL CITRATE (PF) 100 MCG/2ML IJ SOLN
INTRAMUSCULAR | Status: AC
  Filled 2018-06-25: qty 2

## 2018-06-25 MED ORDER — CEFAZOLIN SODIUM-DEXTROSE 2-4 GM/100ML-% IV SOLN
2.0000 g | INTRAVENOUS | Status: AC
  Administered 2018-06-25: 2 g via INTRAVENOUS
  Filled 2018-06-25: qty 100

## 2018-06-25 MED ORDER — GLYCOPYRROLATE 0.2 MG/ML IJ SOLN
INTRAMUSCULAR | Status: AC
  Filled 2018-06-25: qty 1

## 2018-06-25 MED ORDER — SOD CITRATE-CITRIC ACID 500-334 MG/5ML PO SOLN
ORAL | Status: AC
  Administered 2018-06-25: 30 mL
  Filled 2018-06-25: qty 15

## 2018-06-25 MED ORDER — DIPHENHYDRAMINE HCL 25 MG PO CAPS
25.0000 mg | ORAL_CAPSULE | Freq: Four times a day (QID) | ORAL | Status: DC | PRN
  Administered 2018-06-25 – 2018-06-27 (×6): 25 mg via ORAL
  Filled 2018-06-25 (×6): qty 1

## 2018-06-25 MED ORDER — PHENYLEPHRINE 8 MG IN D5W 100 ML (0.08MG/ML) PREMIX OPTIME
INJECTION | INTRAVENOUS | Status: DC | PRN
  Administered 2018-06-25: 60 ug/min via INTRAVENOUS

## 2018-06-25 MED ORDER — OXYTOCIN 40 UNITS IN LACTATED RINGERS INFUSION - SIMPLE MED: 2.5000 [IU]/h | INTRAVENOUS | Status: AC

## 2018-06-25 MED ORDER — OXYTOCIN 40 UNITS IN LACTATED RINGERS INFUSION - SIMPLE MED: 40.0000 [IU] | INTRAVENOUS | Status: DC

## 2018-06-25 MED ORDER — DIBUCAINE 1 % RE OINT: 1.0000 "application " | TOPICAL_OINTMENT | RECTAL | Status: DC | PRN

## 2018-06-25 SURGICAL SUPPLY — 36 items
BENZOIN TINCTURE PRP APPL 2/3 (GAUZE/BANDAGES/DRESSINGS) ×2 IMPLANT
CHLORAPREP W/TINT 26ML (MISCELLANEOUS) ×2 IMPLANT
CLAMP CORD UMBIL (MISCELLANEOUS) IMPLANT
CLOTH BEACON ORANGE TIMEOUT ST (SAFETY) ×2 IMPLANT
DRAIN JACKSON PRT FLT 10 (DRAIN) IMPLANT
DRSG OPSITE POSTOP 4X10 (GAUZE/BANDAGES/DRESSINGS) ×2 IMPLANT
ELECT REM PT RETURN 9FT ADLT (ELECTROSURGICAL) ×2
ELECTRODE REM PT RTRN 9FT ADLT (ELECTROSURGICAL) ×1 IMPLANT
EVACUATOR SILICONE 100CC (DRAIN) IMPLANT
EXTRACTOR VACUUM M CUP 4 TUBE (SUCTIONS) IMPLANT
GLOVE BIO SURGEON STRL SZ 6.5 (GLOVE) ×4 IMPLANT
GLOVE BIOGEL PI IND STRL 7.0 (GLOVE) ×2 IMPLANT
GLOVE BIOGEL PI INDICATOR 7.0 (GLOVE) ×2
GOWN STRL REUS W/TWL LRG LVL3 (GOWN DISPOSABLE) ×4 IMPLANT
HOVERMATT SINGLE USE (MISCELLANEOUS) ×2 IMPLANT
KIT ABG SYR 3ML LUER SLIP (SYRINGE) IMPLANT
NEEDLE HYPO 25X5/8 SAFETYGLIDE (NEEDLE) IMPLANT
NS IRRIG 1000ML POUR BTL (IV SOLUTION) ×2 IMPLANT
PACK C SECTION WH (CUSTOM PROCEDURE TRAY) ×2 IMPLANT
PAD ABD 7.5X8 STRL (GAUZE/BANDAGES/DRESSINGS) ×4 IMPLANT
PAD OB MATERNITY 4.3X12.25 (PERSONAL CARE ITEMS) ×2 IMPLANT
PENCIL SMOKE EVAC W/HOLSTER (ELECTROSURGICAL) ×2 IMPLANT
RTRCTR C-SECT PINK 25CM LRG (MISCELLANEOUS) ×2 IMPLANT
SPONGE LAP 18X18 X RAY DECT (DISPOSABLE) ×2 IMPLANT
STRIP CLOSURE SKIN 1/2X4 (GAUZE/BANDAGES/DRESSINGS) ×2 IMPLANT
SUT CHROMIC 0 CT 1 (SUTURE) ×2 IMPLANT
SUT MNCRL AB 3-0 PS2 27 (SUTURE) ×2 IMPLANT
SUT PLAIN 2 0 (SUTURE) ×4
SUT PLAIN 2 0 XLH (SUTURE) ×2 IMPLANT
SUT PLAIN ABS 2-0 CT1 27XMFL (SUTURE) ×2 IMPLANT
SUT SILK 2 0 SH (SUTURE) IMPLANT
SUT VIC AB 0 CTX 36 (SUTURE) ×4
SUT VIC AB 0 CTX36XBRD ANBCTRL (SUTURE) ×4 IMPLANT
SUT VIC AB 2-0 CT1 (SUTURE) ×2 IMPLANT
TOWEL OR 17X24 6PK STRL BLUE (TOWEL DISPOSABLE) ×2 IMPLANT
TRAY FOLEY W/BAG SLVR 14FR LF (SET/KITS/TRAYS/PACK) ×2 IMPLANT

## 2018-06-25 NOTE — Anesthesia Postprocedure Evaluation (Signed)
Anesthesia Post Note  Patient: Kathy Hill  Procedure(s) Performed: REPEAT CESAREAN SECTION (N/A )     Patient location during evaluation: PACU Anesthesia Type: Spinal Level of consciousness: awake and alert, oriented and patient cooperative Pain management: pain level controlled Vital Signs Assessment: post-procedure vital signs reviewed and stable Respiratory status: spontaneous breathing, nonlabored ventilation and respiratory function stable Cardiovascular status: blood pressure returned to baseline and stable Postop Assessment: patient able to bend at knees and spinal receding Anesthetic complications: no    Last Vitals:  Vitals:   06/25/18 1500 06/25/18 1515  BP: 129/85 (!) 128/95  Pulse: 98 91  Resp: (!) 21 (!) 26  Temp:    SpO2: 100% 100%    Last Pain:  Vitals:   06/25/18 1515  TempSrc:   PainSc: 0-No pain   Pain Goal: Patients Stated Pain Goal: 4 (06/25/18 0825)               Erling CruzJACKSON,Kathy Hill

## 2018-06-25 NOTE — Anesthesia Preprocedure Evaluation (Addendum)
Anesthesia Evaluation  Patient identified by MRN, date of birth, ID band Patient awake    Reviewed: Allergy & Precautions, NPO status , Patient's Chart, lab work & pertinent test results  History of Anesthesia Complications Negative for: history of anesthetic complications  Airway Mallampati: III  TM Distance: >3 FB Neck ROM: Full    Dental  (+) Dental Advisory Given   Pulmonary neg pulmonary ROS,    breath sounds clear to auscultation       Cardiovascular hypertension (PIH),  Rhythm:Regular Rate:Normal     Neuro/Psych negative neurological ROS     GI/Hepatic Neg liver ROS, GERD  Medicated,  Endo/Other  Morbid obesity  Renal/GU negative Renal ROS     Musculoskeletal   Abdominal (+) + obese,   Peds  Hematology plt 212k   Anesthesia Other Findings   Reproductive/Obstetrics (+) Pregnancy                            Anesthesia Physical Anesthesia Plan  ASA: III  Anesthesia Plan: Spinal   Post-op Pain Management:    Induction:   PONV Risk Score and Plan: 2 and Ondansetron, Dexamethasone, Treatment may vary due to age or medical condition and Scopolamine patch - Pre-op  Airway Management Planned: Natural Airway  Additional Equipment:   Intra-op Plan:   Post-operative Plan:   Informed Consent: I have reviewed the patients History and Physical, chart, labs and discussed the procedure including the risks, benefits and alternatives for the proposed anesthesia with the patient or authorized representative who has indicated his/her understanding and acceptance.   Dental advisory given  Plan Discussed with: CRNA and Surgeon  Anesthesia Plan Comments: (Plan routine monitors, SAB)       Anesthesia Quick Evaluation

## 2018-06-25 NOTE — Addendum Note (Signed)
Addendum  created 06/25/18 1719 by Jhonnie GarnerMarshall, Haydan Mansouri M, CRNA   Sign clinical note

## 2018-06-25 NOTE — Progress Notes (Signed)
Patient ID: Kathy Hill, female   DOB: [birth year], 27 y.o.   MRN: [id number]  Pt is comfortable BP (!) 133/91   Pulse 86   Temp 99 F (37.2 C) (Oral)   Resp (!) 24   Ht 5\' 6"  (1.676 m)   Wt 116.1 kg (256 lb)   LMP 08/28/2017   SpO2 98%   Breastfeeding? Unknown   BMI 41.32 kg/m  Fundus firm Bleeding is better after TXA and clot evacuation.  Will give another dose of lysteda in 8 hours Monitor closely

## 2018-06-25 NOTE — Progress Notes (Signed)
Patient ID: Kathy Hill, female   DOB: [birth year], 27 y.o.   MRN: [id number]  Called to see patient because of frequent bleeding BP 125/84   Pulse 81   Temp 99 F (37.2 C) (Oral)   Resp (!) 21   Ht 5\' 6"  (1.676 m)   Wt 116.1 kg (256 lb)   LMP 08/28/2017   SpO2 94%   Breastfeeding? Unknown   BMI 41.32 kg/m   Patient is alert and awake cardiovascular regular rate and rhythm Fundus is firm occasionally boggy Sterile speculum exam her cervix was centimeter dilated and pulled approximately 500 mL of clot Patient with postpartum bleeding after cesarean section. She was given Cytotec and Hemabate. And pitocin Will be given to say. Her hemoglobin is stable at 10.4. If she continues to bleed the plan is to take her back for D&C and possible Hetty BlendBuckley below also possible hysterectomy patient agrees to plan thank you

## 2018-06-25 NOTE — H&P (Signed)
Kathy Hill is a 27 y.o. female [phone] @ [redacted]w[redacted]d presenting for Repeat C/S. Pregnancy complicated by BMI >40; Hx of HSV and 2 previous C/s. Of note she has had a right salpingectomy OB History    Gravida  5   Para  2   Term  2   Preterm  0   AB  2   Living  2     SAB  0   TAB  1   Ectopic  1   Multiple  0   Live Births  2          Past Medical History:  Diagnosis Date  . BV (bacterial vaginosis)    history only  . Ectopic pregnancy   . Eczema   . Genital herpes   . Gonorrhea    history only  . HSV (herpes simplex virus) anogenital infection   . Infection    chlamydia history only  . Obesity   . Ovarian cyst   . Trichimoniasis    history only  . Urinary tract infection    history only   Past Surgical History:  Procedure Laterality Date  . CESAREAN SECTION  2011   [facility]  . CESAREAN SECTION N/A 11/15/2013   Procedure: REPEAT CESAREAN SECTION;  Surgeon: Kirkland Hun, MD;  Location: [facility];  Service: Obstetrics;  Laterality: N/A;  . LAPAROSCOPY  02/18/2012   Procedure: LAPAROSCOPY OPERATIVE;  Surgeon: Catalina Antigua, MD;  Location: [facility];  Service: Gynecology;  Laterality: N/A;  Lysis of Adhesions, Operative laparoscopy for ectopic pregnancy, left salpingectomy,   . THERAPEUTIC ABORTION    . UNILATERAL SALPINGECTOMY     right   Family History: family history includes Breast cancer in her paternal grandmother; Colon polyps in her unknown relative; Diabetes in her paternal grandmother; Heart disease in her father; Hypertension in her father; Kidney disease in her father; Lung cancer in her paternal grandfather; Thyroid disease in her mother. Social History:  reports that she has never smoked. She has never used smokeless tobacco. She reports that she does not drink alcohol or use drugs.     Maternal Diabetes: No Genetic Screening: Normal Maternal Ultrasounds/Referrals: Normal Fetal Ultrasounds or other Referrals:  None Maternal Substance Abuse:   No Significant Maternal Medications:  Meds include: Other: Buproprion and Valtrex Significant Maternal Lab Results:  Lab values include: Group B Strep negative Other Comments:  None  Review of Systems  All other systems reviewed and are negative.  Maternal Medical History:  Contractions: Frequency: rare.    Fetal activity: Perceived fetal activity is normal.    Prenatal Complications - Diabetes: none.      Blood pressure (!) 137/92, pulse 91, temperature 98.6 F (37 C), temperature source Oral, resp. rate 18, height 5\' 6"  (1.676 m), weight 256 lb (116.1 kg), last menstrual period 08/28/2017. Maternal Exam:  Uterine Assessment: Contraction frequency is rare.   Abdomen: Patient reports no abdominal tenderness. Fetal presentation: vertex  Introitus: not evaluated.   Cervix: not evaluated.   Physical Exam  Nursing note and vitals reviewed. Constitutional: She is oriented to person, place, and time. She appears well-developed and well-nourished.  HENT:  Head: Normocephalic and atraumatic.  Neck: Normal range of motion.  Cardiovascular: Normal rate.  Respiratory: Effort normal and breath sounds normal.  GI: Soft. There is no tenderness.  Genitourinary: Vagina normal.  Musculoskeletal: Normal range of motion.  Neurological: She is alert and oriented to person, place, and time.  Skin: Skin is warm and dry.  Psychiatric: She has a normal mood and affect. Her behavior is normal. Thought content normal.    Prenatal labs: ABO, Rh: --/--/O POS (06/28 1005) Antibody: NEG (06/28 1005) Rubella: Immune (11/16 0000) RPR: Non Reactive (06/28 1005)  HBsAg: Negative (11/16 0000)  HIV: Non-reactive (11/16 0000)  GBS:   Negative  Assessment/Plan: Repeat C/S Routine orders  Lori A Clemmons CNM 06/25/2018, 8:33 AM

## 2018-06-25 NOTE — Anesthesia Procedure Notes (Addendum)
Spinal  Patient location during procedure: OR End time: 06/25/2018 9:57 AM Staffing Anesthesiologist: Jairo BenJackson, Carolyne Whitsel, MD Performed: anesthesiologist  Preanesthetic Checklist Completed: patient identified, site marked, surgical consent, pre-op evaluation, timeout performed, IV checked, risks and benefits discussed and monitors and equipment checked Spinal Block Patient position: sitting Prep: site prepped and draped and DuraPrep Patient monitoring: blood pressure, continuous pulse ox, heart rate and cardiac monitor Approach: midline Location: L3-4 Injection technique: single-shot Needle Needle type: Pencan  Needle gauge: 24 G Needle length: 9 cm Assessment Sensory level: T3 Additional Notes Pt identified in Operating room.  Monitors applied. Working IV access confirmed. Sterile prep, drape lumbar spine.  1% lido local L 3,4.  #25ga Quincke into clear CSF L 3,4.  10.5  mg 0.75% Bupivacaine with dextrose, fentanyl, morphine injected with asp CSF beginning and end of injection.  Patient asymptomatic, VSS, no heme aspirated, tolerated well.  Sandford Craze Hoang Reich, MD

## 2018-06-25 NOTE — Transfer of Care (Signed)
Immediate Anesthesia Transfer of Care Note  Patient: Kathy Hill  Procedure(s) Performed: REPEAT CESAREAN SECTION (N/A )  Patient Location: PACU  Anesthesia Type:Spinal  Level of Consciousness: awake, alert  and oriented  Airway & Oxygen Therapy: Patient Spontanous Breathing  Post-op Assessment: Report given to RN and Post -op Vital signs reviewed and stable  Post vital signs: Reviewed and stable  Last Vitals:  Vitals Value Taken Time  BP    Temp    Pulse 80 06/25/2018 11:44 AM  Resp 21 06/25/2018 11:44 AM  SpO2 96 % 06/25/2018 11:44 AM  Vitals shown include unvalidated device data.  Last Pain:  Vitals:   06/25/18 0735  TempSrc: Oral  PainSc: 0-No pain      Patients Stated Pain Goal: 4 (06/25/18 0825)  Complications: No apparent anesthesia complications

## 2018-06-25 NOTE — Anesthesia Postprocedure Evaluation (Signed)
Anesthesia Post Note  Patient: Kathy Hill  Procedure(s) Performed: REPEAT CESAREAN SECTION (N/A )     Patient location during evaluation: Mother Baby Anesthesia Type: Spinal Level of consciousness: awake and alert Pain management: pain level controlled Vital Signs Assessment: post-procedure vital signs reviewed and stable Respiratory status: spontaneous breathing Cardiovascular status: blood pressure returned to baseline Postop Assessment: no headache, no backache, spinal receding, adequate PO intake, no apparent nausea or vomiting and patient able to bend at knees Anesthetic complications: no    Last Vitals:  Vitals:   06/25/18 1615 06/25/18 1634  BP: (!) 133/91 123/89  Pulse: 86 87  Resp: (!) 24 20  Temp:  36.9 C  SpO2: 98% 100%    Last Pain:  Vitals:   06/25/18 1634  TempSrc: Oral  PainSc:    Pain Goal: Patients Stated Pain Goal: 4 (06/25/18 0825)               Ninetta Adelstein

## 2018-06-25 NOTE — Op Note (Signed)
Kathy Section Procedure Note   Shaka D Hill  06/25/2018  Indications: Scheduled Proceedure/Maternal Request   Pre-operative Diagnosis: Prior Kathy Section.   Post-operative Diagnosis: Same   Surgeon: Surgeon(s) and Role:    * Jaymes Graffillard, Treston Coker, MD - Primary   Assistants: Illene Boluslori clemmons CNM   Anesthesia: spinal   Procedure Details:  The patient was seen in the Holding Room. The risks, benefits, complications, treatment options, and expected outcomes were discussed with the patient. The patient concurred with the proposed plan, giving informed consent. identified as Kathy Hill and the procedure verified as C-Section Delivery. A Time Out was held and the above information confirmed.  After induction of anesthesia, the patient was draped and prepped in the usual sterile manner. A transverse incision was made and carried down through the subcutaneous tissue to the fascia. Fascial incision was made in the midline and extended transversely. The fascia was separated from the underlying rectus muscle superiorly and inferiorly. The peritoneum was identified and entered. Peritoneal incision was extended longitudinally with good visualization of bowel and bladder. The utero-vesical peritoneal reflection was incised transversely and the bladder flap was bluntly freed from the lower uterine segment.  An alexsis retractor was placed in the abdomen.   A low transverse uterine incision was made. Delivered from cephalic presentation was a  infant, with Apgar scores of 9 at one minute and 9 at five minutes. Cord ph was not sent the umbilical cord was clamped and cut cord blood was obtained for evaluation. The placenta was removed Intact and appeared normal. The uterine outline, tubes and ovaries appeared normal}. The uterine incision was closed with running locked sutures of 0Vicryl. A second layer 0 vicrlyl was used to imbricate the uterine incision    Hemostasis was observed. Lavage was carried out  until clear. The alexsis was removed.  The peritoneum was closed with 0 chromic.  The muscles were examined and any bleeders were made hemostatic using bovie cautery device.   The fascia was then reapproximated with running sutures of 0 vicryl.  The subcutaneous tissue was reapproximated  With interrupted stitches using 2-0 plain gut. The subcuticular closure was performed using 3-510monocryl     Instrument, sponge, and needle counts were correct prior the abdominal closure and were correct at the conclusion of the case.    Findings: infant was delivered from vtx presentation. The fluid was clear.  The uterus tubes and ovaries appeared normal.   Nuchal cord times one   Estimated Blood Loss: 1539cc   Total IV Fluids: 2500ml  Urine Output: 250CC OF clear urine  Specimens: placenta  Complications: no complications  Disposition: PACU - hemodynamically stable.   Maternal Condition: stable   Baby condition / location:  Couplet care / Skin to Skin  Attending Attestation: I performed the procedure.   Signed: Surgeon(s): Jaymes Graffillard, Chukwuebuka Churchill, MD

## 2018-06-26 ENCOUNTER — Encounter (HOSPITAL_COMMUNITY): Payer: Self-pay

## 2018-06-26 ENCOUNTER — Other Ambulatory Visit: Payer: Self-pay

## 2018-06-26 LAB — CBC
HCT: 23.5 % — ABNORMAL LOW (ref 36.0–46.0)
HEMOGLOBIN: 7.7 g/dL — AB (ref 12.0–15.0)
MCH: 27.6 pg (ref 26.0–34.0)
MCHC: 32.8 g/dL (ref 30.0–36.0)
MCV: 84.2 fL (ref 78.0–100.0)
Platelets: 208 10*3/uL (ref 150–400)
RBC: 2.79 MIL/uL — ABNORMAL LOW (ref 3.87–5.11)
RDW: 16.2 % — AB (ref 11.5–15.5)
WBC: 19.6 10*3/uL — ABNORMAL HIGH (ref 4.0–10.5)

## 2018-06-26 MED ORDER — FERROUS SULFATE 325 (65 FE) MG PO TABS
325.0000 mg | ORAL_TABLET | Freq: Two times a day (BID) | ORAL | Status: DC
  Administered 2018-06-26 – 2018-06-28 (×5): 325 mg via ORAL
  Filled 2018-06-26 (×5): qty 1

## 2018-06-26 MED ORDER — NIFEDIPINE ER OSMOTIC RELEASE 30 MG PO TB24
30.0000 mg | ORAL_TABLET | Freq: Every day | ORAL | Status: DC
  Administered 2018-06-26 – 2018-06-28 (×3): 30 mg via ORAL
  Filled 2018-06-26 (×3): qty 1

## 2018-06-26 MED ORDER — DOCUSATE SODIUM 100 MG PO CAPS
100.0000 mg | ORAL_CAPSULE | Freq: Every day | ORAL | Status: DC | PRN
Start: 2018-06-26 — End: 2018-06-28

## 2018-06-26 NOTE — Lactation Note (Signed)
This note was copied from a baby's chart. Lactation Consultation Note  Patient Name: Boy Lillianne Guadiana Today's Date: 06/26/2018 Reason for consult: Initial assessment;Term  P3 mother whose infant is now 8718 hours old.  Mother breastfed her first two children for a few months each.  Mother was breastfeeding in the cradle hold on the right breast as I entered.  Infant sucking but releasing frequently.  He appeared to be struggling to stay latched on deeply.  Discussed this position with mother and suggested using the football hold instead.  Assisted mother to latch in the football hold on the left breast without difficulty.  Wide gape and flanged lips allowed for an effective latch and audible swallows were noted.  Mother stated this was much more comfortable.  Baby is on phototherapy with a bili iblanket which is making positioning more difficult.    Encouraged mother to feed 8-12 times/24 hours or more if baby shows cues.  Continue hand expression after feeds and give back any EBM mother may obtain from hand expression. Mom made aware of O/P services, breastfeeding support groups, community resources, and our phone # for post-discharge questions. Mother will call for assistance as needed.  RN in room and aware of feeding.   Maternal Data Formula Feeding for Exclusion: No Has patient been taught Hand Expression?: Yes Does the patient have breastfeeding experience prior to this delivery?: Yes  Feeding Feeding Type: Breast Fed Nipple Type: Regular Length of feed: 15 min(still feeding when I left room)  LATCH Score Latch: Grasps breast easily, tongue down, lips flanged, rhythmical sucking.  Audible Swallowing: Spontaneous and intermittent  Type of Nipple: Everted at rest and after stimulation  Comfort (Breast/Nipple): Soft / non-tender  Hold (Positioning): Assistance needed to correctly position infant at breast and maintain latch.  LATCH Score: 9  Interventions Interventions:  Breast feeding basics reviewed;Assisted with latch;Skin to skin;Breast massage;Hand express;Support pillows;Breast compression  Lactation Tools Discussed/Used     Consult Status Consult Status: Follow-up Date: 06/27/18 Follow-up type: In-patient    Sujay Grundman R Rosaland Shiffman 06/26/2018, 5:08 AM

## 2018-06-26 NOTE — Plan of Care (Signed)
  Problem: Activity: Goal: Ability to tolerate increased activity will improve Outcome: Progressing Note:  Pt ambulates in room without difficulty & tolerates well.  Pt has ambulated in the hallway this shift.   Problem: Coping: Goal: Ability to identify and utilize available resources and services will improve Outcome: Progressing Note:  EDS results = 15.  Discussed with pt her history of depression during the pregnancy, however, she states she has been fine without her meds.  She is aware that CSW will consult.   Problem: Life Cycle: Goal: Chance of risk for complications during the postpartum period will decrease Outcome: Progressing Note:  Fundas firm & midline, lochia WNL & without clots.   Problem: Role Relationship: Goal: Ability to demonstrate positive interaction with newborn will improve Outcome: Progressing   Problem: Skin Integrity: Goal: Demonstration of wound healing without infection will improve Outcome: Progressing

## 2018-06-26 NOTE — Progress Notes (Addendum)
Subjective: Postpartum Day 1: Cesarean Delivery Patient reports incisional pain, tolerating PO and no problems voiding.    Patient denies symptoms of anemia. Was sitting up in chair about to feed baby when I visited. Patient looked well.   Objective: Vitals:   06/26/18 0220 06/26/18 0300 06/26/18 0525 06/26/18 0804  BP:   130/72   Pulse:   99 97  Resp:   18   Temp:   99.3 F (37.4 C)   TempSrc:   Oral   SpO2: 100% 97% 98% 98%  Weight:      Height:        Physical Exam:  General: alert and cooperative Lochia: appropriate Uterine Fundus: firm Incision: healing well, no significant drainage, no dehiscence, no significant erythema DVT Evaluation: No evidence of DVT seen on physical exam. Negative Homan's sign. No cords or calf tenderness. No significant calf/ankle edema.  Recent Labs    06/25/18 1848 06/26/18 0601  HGB 9.4* 7.7*  HCT 28.8* 23.5*    Assessment/Plan: Status post Cesarean section. Doing well postoperatively.  Continue current care. Asymptomatic for anemia, no blood transfusion indicated at this time. Will start patient on PO Iron BID.   Janeece Riggersllis K Greer 06/26/2018, 8:53 AM   I saw and examined patient at bedside and agree with above findings, assessment and plan per CNM Kathalene FramesEllis Greer. She desires outpatient circumcision of her baby boy.  Dr. Sallye OberKulwa.

## 2018-06-27 MED ORDER — PROMETHAZINE HCL 25 MG/ML IJ SOLN: 6.2500 mg | INTRAMUSCULAR | Status: DC | PRN

## 2018-06-27 MED ORDER — MIDAZOLAM HCL 2 MG/2ML IJ SOLN: 0.5000 mg | Freq: Once | INTRAMUSCULAR | Status: DC | PRN

## 2018-06-27 MED ORDER — KETOROLAC TROMETHAMINE 30 MG/ML IJ SOLN: 30.0000 mg | Freq: Once | INTRAMUSCULAR | Status: AC | PRN

## 2018-06-27 MED ORDER — MORPHINE SULFATE (PF) 4 MG/ML IV SOLN: 1.0000 mg | INTRAVENOUS | Status: DC | PRN

## 2018-06-27 MED ORDER — MEPERIDINE HCL 25 MG/ML IJ SOLN: 6.2500 mg | INTRAMUSCULAR | Status: DC | PRN

## 2018-06-27 MED ORDER — MEDROXYPROGESTERONE ACETATE 150 MG/ML IM SUSP
150.0000 mg | Freq: Once | INTRAMUSCULAR | Status: AC
  Administered 2018-06-27: 150 mg via INTRAMUSCULAR
  Filled 2018-06-27: qty 1

## 2018-06-27 NOTE — Progress Notes (Signed)
Subjective: Postpartum Day 2: Cesarean Delivery Patient reports incisional pain, tolerating PO and no problems voiding.  Pain being tolerated by motrin/tylenol and percocet's. Pt is bottle and breastfeeding infant. Pt tolerates ambulation, + passing gas, no bowel movement, notes scant about of vaginal bleeding. Patient denies symptoms of anemia. Was sitting up in bed bonding with infant in lap on billi blanket. Patient looked well.   Objective: Vitals:   06/26/18 0804 06/26/18 1023 06/26/18 1300 06/27/18 0548  BP:  125/75 135/87 127/73  Pulse: 97 92 95 89  Resp:  18 (!) 22 20  Temp:  98.6 F (37 C) 98.7 F (37.1 C) 98.8 F (37.1 C)  TempSrc:  Oral Oral Oral  SpO2: 98% 100% 99% 98%  Weight:      Height:        Physical Exam:  General: alert and cooperative Lochia: appropriate Uterine Fundus: firm Incision: healing well, no significant drainage, no dehiscence, no significant erythema, wound still covered with island dressing  DVT Evaluation: No evidence of DVT seen on physical exam. Negative Homan's sign. No cords or calf tenderness. No significant calf/ankle edema.  Recent Labs    06/25/18 1848 06/26/18 0601  HGB 9.4* 7.7*  HCT 28.8* 23.5*    Assessment/Plan: Status post Cesarean section. Doing well postoperatively.  Continue current care. Asymptomatic for anemia, no blood transfusion indicated at this time. Will start patient on PO Iron BID.  Depo for contraception before discharge Plan to D/c to home tomorrow, but may go tonight if Peds d/c infant billi lights.  Anemia: PO IRON BID.   Zacary Bauer CNM, NP 06/27/2018, 9:08 AM

## 2018-06-27 NOTE — Progress Notes (Signed)
CSW acknowledges consult.  CSW attempted to meet with MOB, however MOB had several room guest.  CSW will attempt to visit with MOB on tomorrow.   Kathy Hill, MSW, LCSW Clinical Social Work [phone] 

## 2018-06-28 DIAGNOSIS — O139 Gestational [pregnancy-induced] hypertension without significant proteinuria, unspecified trimester: Secondary | ICD-10-CM

## 2018-06-28 MED ORDER — NIFEDIPINE ER 30 MG PO TB24: 30.0000 mg | ORAL_TABLET | Freq: Every day | ORAL | 1 refills | Status: AC

## 2018-06-28 MED ORDER — OXYCODONE-ACETAMINOPHEN 5-325 MG PO TABS: 1.0000 | ORAL_TABLET | Freq: Four times a day (QID) | ORAL | 0 refills | Status: AC | PRN

## 2018-06-28 MED ORDER — IBUPROFEN 600 MG PO TABS: 600.0000 mg | ORAL_TABLET | Freq: Four times a day (QID) | ORAL | 0 refills | Status: DC | PRN

## 2018-06-28 NOTE — Progress Notes (Signed)
Message left with [facility] nurse to follow up postpartum for weekly blood pressure checks.

## 2018-06-28 NOTE — Discharge Instructions (Signed)
Postpartum Care After Cesarean Delivery The period of time right after you deliver your newborn is called the postpartum period. What kind of medical care will I receive?  You may continue to receive fluids and medicines through an IV tube inserted into one of your veins.  You may have small, flexible tube (catheter) draining urine from your bladder into a bag outside of your body. The catheter will be removed as soon as possible.  You may be given a squirt bottle to use when you go to the bathroom. You may use this until you are comfortable wiping as usual. To use the squirt bottle, follow these steps: ? Before you urinate, fill the squirt bottle with warm water. The water should be warm. Do not use hot water. ? After you urinate, while you are sitting on the toilet, use the squirt bottle to rinse the area around your urethra and vaginal opening. This rinses away any urine and blood. ? You may do this instead of wiping. As you start healing, you may use the squirt bottle before wiping yourself. Make sure to wipe gently. ? Fill the squirt bottle with clean water every time you use the bathroom.  You will be given sanitary pads to wear.  Your incision will be monitored to make sure it is healing properly. You will be told when it is safe for your stitches, staples, or skin adhesive tape to be removed. What can I expect?  You may not feel the need to urinate for several hours after delivery.  You will have some soreness and pain in your abdomen. You may have a small amount of blood or clear fluid coming from your incision.  If you are breastfeeding, you may have uterine contractions every time you breastfeed for up to several weeks postpartum. Uterine contractions help your uterus return to its normal size.  It is normal to have vaginal bleeding (lochia) after delivery. The amount and appearance of lochia is often similar to a menstrual period in the first week after delivery. It will  gradually decrease over the next few weeks to a dry, yellow-brown discharge. For most women, lochia stops completely by 6-8 weeks after delivery. Vaginal bleeding can vary from woman to woman.  Within the first few days after delivery, you may have breast engorgement. This is when your breasts feel heavy, full, and uncomfortable. Your breasts may also throb and feel hard, tightly stretched, warm, and tender. After this occurs, you may have milk leaking from your breasts.Your health care provider can help you relieve discomfort due to breast engorgement. Breast engorgement should go away within a few days.  You may feel more sad or worried than normal due to hormonal changes after delivery. These feelings should not last more than a few days. If these feelings do not go away after several days, speak with your health care provider. How should I care for myself?  Tell your health care provider if you have pain or discomfort.  Drink enough water to keep your urine clear or pale yellow.  Wash your hands thoroughly with soap and water for at least 20 seconds after changing your sanitary pads or using the toilet, and before holding or feeding your baby.  If you are not breastfeeding, avoid touching your breasts a lot. Doing this can make your breasts produce more milk.  If you become weak or lightheaded, or you feel like you might faint, ask for help before: ? Getting out of bed. ? Showering.  Change your sanitary pads frequently. Watch for any changes in your flow, such as a sudden increase in volume, a change in color, or the passing of large blood clots. If you pass a blood clot from your vagina, save it to show to your health care provider. Do not flush blood clots down the toilet without having your health care provider look at them. °· Make sure that all your vaccinations are up to date. This can help protect you and your baby from getting certain diseases. You may need to have immunizations done  before you leave the hospital. °· If desired, talk with your health care provider about methods of family planning or birth control (contraception). °How can I start bonding with my baby? °Spending as much time as possible with your baby is very important. During this time, you and your baby can get to know each other and develop a bond. Having your baby stay with you in your room (rooming in) can give you time to get to know your baby. Rooming in can also help you become comfortable caring for your baby. Breastfeeding can also help you bond with your baby. °How can I plan for returning home with my baby? °· Make sure that you have a car seat installed in your vehicle. °? Your car seat should be checked by a certified car seat installer to make sure that it is installed safely. °? Make sure that your baby fits into the car seat safely. °· Ask your health care provider any questions you have about caring for yourself or your baby. Make sure that you are able to contact your health care provider with any questions after leaving the hospital. °This information is not intended to replace advice given to you by your health care provider. Make sure you discuss any questions you have with your health care provider. °Document Released: 09/05/2012 Document Revised: 05/16/2016 Document Reviewed: 11/16/2015 °Elsevier Interactive Patient Education © 2018 Else[employer] Inc[employer] ° ° °Postpartum Depression and Baby Blues °The postpartum period begins right after the birth of a baby. During this time, there is often a great amount of joy and excitement. It is also a time of many changes in the life of the parents. Regardless of how many times a mother gives birth, each child brings new challenges and dynamics to the family. It is not unusual to have feelings of excitement along with confusing shifts in moods, emotions, and thoughts. All mothers are at risk of developing postpartum depression or the "baby blues." These mood changes can occur  right after giving birth, or they may occur many months after giving birth. The baby blues or postpartum depression can be mild or severe. Additionally, postpartum depression can go away rather quickly, or it can be a long-term condition. °What are the causes? °Raised hormone levels and the rapid drop in those levels are thought to be a main cause of postpartum depression and the baby blues. A number of hormones change during and after pregnancy. Estrogen and progesterone usually decrease right after the delivery of your baby. The levels of thyroid hormone and various cortisol steroids also rapidly drop. Other factors that play a role in these mood changes include major life events and genetics. °What increases the risk? °If you have any of the following risks for the baby blues or postpartum depression, know what symptoms to watch out for during the postpartum period. Risk factors that may increase the likelihood of getting the baby blues or postpartum depression include: °·   Having a personal or family history of depression. °· Having depression while being pregnant. °· Having premenstrual mood issues or mood issues related to oral contraceptives. °· Having a lot of life stress. °· Having marital conflict. °· Lacking a social support network. °· Having a baby with special needs. °· Having health problems, such as diabetes. ° °What are the signs or symptoms? °Symptoms of baby blues include: °· Brief changes in mood, such as going from extreme happiness to sadness. °· Decreased concentration. °· Difficulty sleeping. °· Crying spells, tearfulness. °· Irritability. °· Anxiety. ° °Symptoms of postpartum depression typically begin within the first month after giving birth. These symptoms include: °· Difficulty sleeping or excessive sleepiness. °· Marked weight loss. °· Agitation. °· Feelings of worthlessness. °· Lack of interest in activity or food. ° °Postpartum psychosis is a very serious condition and can be  dangerous. Fortunately, it is rare. Displaying any of the following symptoms is cause for immediate medical attention. Symptoms of postpartum psychosis include: °· Hallucinations and delusions. °· Bizarre or disorganized behavior. °· Confusion or disorientation. ° °How is this diagnosed? °A diagnosis is made by an evaluation of your symptoms. There are no medical or lab tests that lead to a diagnosis, but there are various questionnaires that a health care provider may use to identify those with the baby blues, postpartum depression, or psychosis. Often, a screening tool called the Edinburgh Postnatal Depression Scale is used to diagnose depression in the postpartum period. °How is this treated? °The baby blues usually goes away on its own in 1-2 weeks. Social support is often all that is needed. You will be encouraged to get adequate sleep and rest. Occasionally, you may be given medicines to help you sleep. °Postpartum depression requires treatment because it can last several months or longer if it is not treated. Treatment may include individual or group therapy, medicine, or both to address any social, physiological, and psychological factors that may play a role in the depression. Regular exercise, a healthy diet, rest, and social support may also be strongly recommended. °Postpartum psychosis is more serious and needs treatment right away. Hospitalization is often needed. °Follow these instructions at home: °· Get as much rest as you can. Nap when the baby sleeps. °· Exercise regularly. Some women find yoga and walking to be beneficial. °· Eat a balanced and nourishing diet. °· Do little things that you enjoy. Have a cup of tea, take a bubble bath, read your favorite magazine, or listen to your favorite music. °· Avoid alcohol. °· Ask for help with household chores, cooking, grocery shopping, or running errands as needed. Do not try to do everything. °· Talk to people close to you about how you are feeling.  Get support from your partner, family members, friends, or other new moms. °· Try to stay positive in how you think. Think about the things you are grateful for. °· Do not spend a lot of time alone. °· Only take over-the-counter or prescription medicine as directed by your health care provider. °· Keep all your postpartum appointments. °· Let your health care provider know if you have any concerns. °Contact a health care provider if: °You are having a reaction to or problems with your medicine. °Get help right away if: °· You have suicidal feelings. °· You think you may harm the baby or someone else. °This information is not intended to replace advice given to you by your health care provider. Make sure you discuss any questions you have   with your health care provider. °Document Released: 09/15/2004 Document Revised: 05/19/2016 Document Reviewed: 09/23/2013 °Elsevier Interactive Patient Education © 2017 Elsevier Inc. ° ° °Preeclampsia and Eclampsia °Preeclampsia is a serious condition that develops only during pregnancy. It is also called toxemia of pregnancy. This condition causes high blood pressure along with other symptoms, such as swelling and headaches. These symptoms may develop as the condition gets worse. Preeclampsia may occur at 20 weeks of pregnancy or later. °Diagnosing and treating preeclampsia early is very important. If not treated early, it can cause serious problems for you and your baby. One problem it can lead to is eclampsia, which is a condition that causes muscle jerking or shaking (convulsions or seizures) in the mother. Delivering your baby is the best treatment for preeclampsia or eclampsia. Preeclampsia and eclampsia symptoms usually go away after your baby is born. °What are the causes? °The cause of preeclampsia is not known. °What increases the risk? °The following risk factors make you more likely to develop preeclampsia: °· Being pregnant for the first time. °· Having had preeclampsia  during a past pregnancy. °· Having a family history of preeclampsia. °· Having high blood pressure. °· Being pregnant with twins or triplets. °· Being 35 or older. °· Being African-American. °· Having kidney disease or diabetes. °· Having medical conditions such as lupus or blood diseases. °· Being very overweight (obese). ° °What are the signs or symptoms? °The earliest signs of preeclampsia are: °· High blood pressure. °· Increased protein in your urine. Your health care provider will check for this at every visit before you give birth (prenatal visit). ° °Other symptoms that may develop as the condition gets worse include: °· Severe headaches. °· Sudden weight gain. °· Swelling of the hands, face, legs, and feet. °· Nausea and vomiting. °· Vision problems, such as blurred or double vision. °· Numbness in the face, arms, legs, and feet. °· Urinating less than usual. °· Dizziness. °· Slurred speech. °· Abdominal pain, especially upper abdominal pain. °· Convulsions or seizures. ° °Symptoms generally go away after giving birth. °How is this diagnosed? °There are no screening tests for preeclampsia. Your health care provider will ask you about symptoms and check for signs of preeclampsia during your prenatal visits. You may also have tests that include: °· Urine tests. °· Blood tests. °· Checking your blood pressure. °· Monitoring your baby’s heart rate. °· Ultrasound. ° °How is this treated? °You and your health care provider will determine the treatment approach that is best for you. Treatment may include: °· Having more frequent prenatal exams to check for signs of preeclampsia, if you have an increased risk for preeclampsia. °· Bed rest. °· Reducing how much salt (sodium) you eat. °· Medicine to lower your blood pressure. °· Staying in the hospital, if your condition is severe. There, treatment will focus on controlling your blood pressure and the amount of fluids in your body (fluid retention). °· You may need  to take medicine (magnesium sulfate) to prevent seizures. This medicine may be given as an injection or through an IV tube. °· Delivering your baby early, if your condition gets worse. You may have your labor started with medicine (induced), or you may have a cesarean delivery. ° °Follow these instructions at home: °Eating and drinking ° °· Drink enough fluid to keep your urine clear or pale yellow. °· Eat a healthy diet that is low in sodium. Do not add salt to your food. Check nutrition labels to see how   sodium a food or beverage contains.  Avoid caffeine. Lifestyle  Do not use any products that contain nicotine or tobacco, such as cigarettes and e-cigarettes. If you need help quitting, ask your health care provider.  Do not use alcohol or drugs.  Avoid stress as much as possible. Rest and get plenty of sleep. General instructions  Take over-the-counter and prescription medicines only as told by your health care provider.  When lying down, lie on your side. This keeps pressure off of your baby.  When sitting or lying down, raise (elevate) your feet. Try putting some pillows underneath your lower legs.  Exercise regularly. Ask your health care provider what kinds of exercise are best for you.  Keep all follow-up and prenatal visits as told by your health care provider. This is important. How is this prevented? To prevent preeclampsia or eclampsia from developing during another pregnancy:  Get proper medical care during pregnancy. Your health care provider may be able to prevent preeclampsia or diagnose and treat it early.  Your health care provider may have you take a low-dose aspirin or a calcium supplement during your next pregnancy.  You may have tests of your blood pressure and kidney function after giving birth.  Maintain a healthy weight. Ask your health care provider for help managing weight gain during pregnancy.  Work with your health care provider to manage any  long-term (chronic) health conditions you have, such as diabetes or kidney problems.  Contact a health care provider if:  You gain more weight than expected.  You have headaches.  You have nausea or vomiting.  You have abdominal pain.  You feel dizzy or light-headed. Get help right away if:  You develop sudden or severe swelling anywhere in your body. This usually happens in the legs.  You gain 5 lbs (2.3 kg) or more during one week.  You have severe: ? Abdominal pain. ? Headaches. ? Dizziness. ? Vision problems. ? Confusion. ? Nausea or vomiting.  You have a seizure.  You have trouble moving any part of your body.  You develop numbness in any part of your body.  You have trouble speaking.  You have any abnormal bleeding.  You pass out. This information is not intended to replace advice given to you by your health care provider. Make sure you discuss any questions you have with your health care provider. Document Released: 12/09/2000 Document Revised: 08/09/2016 Document Reviewed: 07/18/2016 Elsevier Interactive Patient Education  [employer]2018 Else[employer] Inc.

## 2018-06-28 NOTE — Progress Notes (Signed)
CSW received consult for hx of Anxiety and Depression.  Also, MOB scored a 15 on the Edinburgh Postnatal Depression Scale.  CSW met with MOB and FOB to offer support and complete assessment.   MOB was extremely pleasant and welcoming of CSW's visit.  FOB was pleasant as well, though quiet and only minimally involved in the conversation.  He cared for baby as CSW spoke with MOB.  She gave permission to speak openly with him in the room. MOB reports that she was dealing with some family issues during the beginning of this pregnancy, as well as stress within the relationship with FOB.  She reports that increased communication and speaking up about how she is feeling has helped her work through these situations and that although she describes the beginning of the pregnancy as "rough," she reports that "things got brighter towards the end."  The couple reports that they are in a positive place in their relationship now and that they are living together.  This is MOB's third child and FOB's first.  He states feeling "good" about becoming a father and seemed very involved in the care of his son.  They report having everything they need for infant at home and a great support system.  MOB states this pregnancy was not planned, but that she is happy about baby now.  MOB states she was prescribed an antidepressant during pregnancy, which she feels helped with her symptoms.  She went off of the medication about a month ago because she felt better and did not feel she needed it anymore.  CSW discussed this with her and she states she is willing to restart if symptoms return and is aware that it takes time to reach a therapeutic level in the bloodstream.  She also informed CSW that she is seeing a counselor (though could not recall the name) on [address], whom she has enjoyed talking with.  She states plans to follow up with counseling, but states she may look for other counselors to compare, just to make sure she has the  best fit.  She has time off from work until October.  She states her job was causing her stress as well and is thankful for time off.  MOB reports feeling emotionally well at this time. CSW provided education regarding the baby blues period vs. perinatal mood disorders, and commends MOB for seeking treatment when she realized she was experiencing symptoms of depression.  CSW recommends self-evaluation during the postpartum time period using the New Mom Checklist from Postpartum Progress, as well as the [place] Postnatal Depression Scale and encouraged MOB to contact a medical professional if symptoms are noted at any time.   CSW provided review of Sudden Infant Death Syndrome (SIDS) precautions.   CSW identifies no further need for intervention and no barriers to discharge at this time.

## 2018-06-28 NOTE — Lactation Note (Signed)
This note was copied from a baby's chart. Lactation Consultation Note  Patient Name: Kathy Hill Today's Date: 06/28/2018 Reason for consult: Follow-up assessment  Visited with [name] on day of discharge, baby 5772 hrs old.  Baby at 6% weight loss.  Mom has been primarily formula feeding baby by bottle.  Mom states her breasts are filling up and she is offering breast to baby.  Mom states baby isn't wanting to work for it.  Talked about importance of frequent feedings at the breast.  Goal of >8 breastfeedings in 24 hrs.   Mom has a Medela DEBP at home.  Recommended she pump both breasts 15-20 mins whenever baby gets formula. Paced bottle feeding reviewed.  Baby crying, and offered to assist/assess baby latched to the breast.  Mom declined assistance.  Mom aware of OP lactation services available to her.  Encouraged her to call.    Consult Status Consult Status: Complete Date: 06/28/18 Follow-up type: Call as needed    Judee ClaraSmith, Lenton Gendreau E 06/28/2018, 11:28 AM

## 2018-06-28 NOTE — Discharge Summary (Signed)
OB Discharge Summary     Patient Name: Kathy Hill DOB: 12/19/1991 MRN: [id number]  Date of admission: 06/25/2018 Delivering MD: Jaymes GraffILLARD, NAIMA   Date of discharge: 06/28/2018  Admitting diagnosis: Prior Cesarean Section Intrauterine pregnancy: [id number]     Secondary diagnosis:  Active Problems:   S/P cesarean section   Pregnancy induced hypertension     Discharge diagnosis: Term Pregnancy Delivered and Gestational Hypertension                                                                                                Post partum procedures:n/a  Augmentation: n/a  Complications: None  Hospital course:  Sceduled C/S   27 y.o. yo [id number] at 5147w1d was admitted to the hospital 06/25/2018 for scheduled cesarean section with the following indication:Elective Repeat.  Membrane Rupture Time/Date: 10:36 AM ,06/25/2018   Patient delivered a Viable infant.06/25/2018  Details of operation can be found in separate operative note.  Pateint had an uncomplicated postpartum course.  She is ambulating, tolerating a regular diet, passing flatus, and urinating well. Patient is discharged home in stable condition on  06/28/18         Physical exam  Vitals:   06/27/18 1029 06/27/18 1505 06/27/18 2300 06/28/18 0602  BP: 133/83 134/86 113/71 121/81  Pulse: (!) 102 (!) 118 (!) 108 91  Resp:  16  16  Temp:  98.2 F (36.8 C) 98.4 F (36.9 C) 98.5 F (36.9 C)  TempSrc:  Oral Oral Oral  SpO2:      Weight:      Height:       General: alert, cooperative and no distress Lochia: appropriate Uterine Fundus: firm Incision: Healing well with no significant drainage, No significant erythema, Dressing is clean, dry, and intact DVT Evaluation: No evidence of DVT seen on physical exam. Negative Homan's sign. No cords or calf tenderness. No significant calf/ankle edema. Labs: Lab Results  Component Value Date   WBC 19.6 (H) 06/26/2018   HGB 7.7 (L) 06/26/2018   HCT 23.5 (L) 06/26/2018   MCV 84.2  06/26/2018   PLT 208 06/26/2018   CMP Latest Ref Rng & Units 06/25/2018  Glucose 70 - 99 mg/dL 96  BUN 6 - 20 mg/dL 9  Creatinine 1.470.44 - 8.291.00 mg/dL 5.620.76  Sodium 130135 - 865145 mmol/L 135  Potassium 3.5 - 5.1 mmol/L 4.3  Chloride 98 - 111 mmol/L 104  CO2 22 - 32 mmol/L 22  Calcium 8.9 - 10.3 mg/dL 7.8(I8.6(L)  Total Protein 6.5 - 8.1 g/dL 6.2(L)  Total Bilirubin 0.3 - 1.2 mg/dL 0.7  Alkaline Phos 38 - 126 U/L 117  AST 15 - 41 U/L 27  ALT 0 - 44 U/L 16    Discharge instruction: per After Visit Summary and "Baby and Me Booklet". [facility] nurses will see patient weekly for blood pressure checks.   After visit meds:  Allergies as of 06/28/2018   No Known Allergies     Medication List    STOP taking these medications   diphenhydramine-acetaminophen 25-500 MG Tabs tablet Commonly known as:  TYLENOL PM  TAKE these medications   famotidine 20 MG tablet Commonly known as:  PEPCID Take 1 tablet (20 mg total) by mouth 2 (two) times daily.   ibuprofen 600 MG tablet Commonly known as:  ADVIL,MOTRIN Take 1 tablet (600 mg total) by mouth every 6 (six) hours as needed for mild pain, moderate pain or cramping.   NIFEdipine 30 MG 24 hr tablet Commonly known as:  PROCARDIA-XL/ADALAT CC Take 1 tablet (30 mg total) by mouth daily.   oxyCODONE-acetaminophen 5-325 MG tablet Commonly known as:  PERCOCET/ROXICET Take 1 tablet by mouth every 6 (six) hours as needed for severe pain.   PRENATAL 1+1 PO Take 2 each by mouth daily.   valACYclovir 500 MG tablet Commonly known as:  VALTREX Take 500 mg by mouth daily.       Diet: routine diet  Activity: Advance as tolerated. Pelvic rest for 6 weeks.   Outpatient follow up:6 weeks Follow up Appt:No future appointments. Follow up Visit:No follow-ups on file.  Postpartum contraception: Depo Provera  Newborn Data: Live born female  Birth Weight: 8 lb (3630 g) APGAR: 9, 9  Newborn Delivery   Birth date/time:  06/25/2018 10:37:00 Delivery  type:  C-Section, Low Transverse Trial of labor:  No C-section categorization:  Repeat     Baby Feeding: Bottle and Breast Disposition:home with mother   06/28/2018 Janeece Riggers, CNM

## 2018-11-24 ENCOUNTER — Encounter (HOSPITAL_COMMUNITY): Payer: Self-pay | Admitting: Emergency Medicine

## 2018-11-24 ENCOUNTER — Emergency Department (HOSPITAL_COMMUNITY): Payer: 59

## 2018-11-24 ENCOUNTER — Emergency Department (HOSPITAL_COMMUNITY)
Admission: EM | Admit: 2018-11-24 | Discharge: 2018-11-24 | Disposition: A | Discharge status: Home / Self Care | Payer: 59 | Attending: Emergency Medicine | Admitting: Emergency Medicine

## 2018-11-24 ENCOUNTER — Other Ambulatory Visit: Payer: Self-pay

## 2018-11-24 DIAGNOSIS — M7918 Myalgia, other site: Secondary | ICD-10-CM

## 2018-11-24 DIAGNOSIS — Y999 Unspecified external cause status: Secondary | ICD-10-CM | POA: Insufficient documentation

## 2018-11-24 DIAGNOSIS — Z79899 Other long term (current) drug therapy: Secondary | ICD-10-CM | POA: Diagnosis not present

## 2018-11-24 DIAGNOSIS — Y92009 Unspecified place in unspecified non-institutional (private) residence as the place of occurrence of the external cause: Secondary | ICD-10-CM | POA: Diagnosis not present

## 2018-11-24 DIAGNOSIS — S9032XA Contusion of left foot, initial encounter: Secondary | ICD-10-CM | POA: Diagnosis not present

## 2018-11-24 DIAGNOSIS — M25561 Pain in right knee: Secondary | ICD-10-CM | POA: Insufficient documentation

## 2018-11-24 DIAGNOSIS — Y939 Activity, unspecified: Secondary | ICD-10-CM | POA: Diagnosis not present

## 2018-11-24 MED ORDER — ACETAMINOPHEN 500 MG PO TABS
1000.0000 mg | ORAL_TABLET | Freq: Once | ORAL | Status: AC
  Administered 2018-11-24: 1000 mg via ORAL
  Filled 2018-11-24: qty 2
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
## 2018-11-24 NOTE — Discharge Instructions (Signed)
You can take Tylenol or Ibuprofen as directed for pain. You can alternate Tylenol and Ibuprofen every 4 hours. If you take Tylenol at 1pm, then you can take Ibuprofen at 5pm. Then you can take Tylenol again at 9pm.   Follow the RICE (Rest, Ice, Compression, Elevation) protocol as directed.   Follow-up with your primary care doctor or the [facility] to establish primary care doctor.  Return to emergency department for any chest pain, difficulty breathing, numbness/weakness of your arms or legs, discoloration of your arms or legs, if you feel unsafe at home or any other worsening concerning symptoms.

## 2018-11-24 NOTE — ED Notes (Signed)
Got patient vitals patient is resting with call bell in reach 

## 2020-01-02 ENCOUNTER — Ambulatory Visit: Payer: Medicaid Other | Attending: Internal Medicine

## 2020-01-02 DIAGNOSIS — Z20822 Contact with and (suspected) exposure to covid-19: Secondary | ICD-10-CM

## 2020-01-04 ENCOUNTER — Telehealth: Payer: Self-pay | Admitting: Adult Health

## 2020-01-04 LAB — NOVEL CORONAVIRUS, NAA: SARS-CoV-2, NAA: DETECTED — AB

## 2020-01-04 NOTE — Telephone Encounter (Signed)
Patient called about Positive Covid test.  2 patient identifiers confirmed.  Date Tested: 01/02/2020  Date of Symptom onset: 12/30/2019   Symptoms: Mild     Isolation Recommendations:  Patient understands the needs to stay in isolation for a total of 10 days from onset of symptom or 14 days total from date of testing if no symptom. Reviewed Masking.    Supportive Care Recommendations: Encouraged plenty of fluid intake, Tylenol per package directions, and to remain as active as possible.    Patient knows the health department may be in touch.    I answered all of patient's questions and all concerns addressed.  ER precautions reviewed.  Gave information on My chart.    Time Spent: 5 minutes  Lillard Anes, NP

## 2020-03-15 ENCOUNTER — Emergency Department (HOSPITAL_BASED_OUTPATIENT_CLINIC_OR_DEPARTMENT_OTHER): Payer: BC Managed Care – PPO

## 2020-03-15 ENCOUNTER — Emergency Department (HOSPITAL_BASED_OUTPATIENT_CLINIC_OR_DEPARTMENT_OTHER)
Admission: EM | Admit: 2020-03-15 | Discharge: 2020-03-15 | Disposition: A | Discharge status: Home / Self Care | Payer: BC Managed Care – PPO | Attending: Emergency Medicine | Admitting: Emergency Medicine

## 2020-03-15 ENCOUNTER — Other Ambulatory Visit: Payer: Self-pay

## 2020-03-15 ENCOUNTER — Encounter (HOSPITAL_BASED_OUTPATIENT_CLINIC_OR_DEPARTMENT_OTHER): Payer: Self-pay | Admitting: *Deleted

## 2020-03-15 DIAGNOSIS — W01190A Fall on same level from slipping, tripping and stumbling with subsequent striking against furniture, initial encounter: Secondary | ICD-10-CM | POA: Diagnosis not present

## 2020-03-15 DIAGNOSIS — S0990XA Unspecified injury of head, initial encounter: Secondary | ICD-10-CM | POA: Diagnosis present

## 2020-03-15 DIAGNOSIS — S8001XA Contusion of right knee, initial encounter: Secondary | ICD-10-CM

## 2020-03-15 DIAGNOSIS — Y9389 Activity, other specified: Secondary | ICD-10-CM | POA: Diagnosis not present

## 2020-03-15 DIAGNOSIS — S00412A Abrasion of left ear, initial encounter: Secondary | ICD-10-CM | POA: Diagnosis not present

## 2020-03-15 DIAGNOSIS — Y998 Other external cause status: Secondary | ICD-10-CM | POA: Insufficient documentation

## 2020-03-15 DIAGNOSIS — Y92013 Bedroom of single-family (private) house as the place of occurrence of the external cause: Secondary | ICD-10-CM | POA: Diagnosis not present

## 2020-03-15 DIAGNOSIS — Z79899 Other long term (current) drug therapy: Secondary | ICD-10-CM | POA: Insufficient documentation

## 2020-03-15 DIAGNOSIS — T148XXA Other injury of unspecified body region, initial encounter: Secondary | ICD-10-CM

## 2020-03-15 DIAGNOSIS — W19XXXA Unspecified fall, initial encounter: Secondary | ICD-10-CM

## 2020-03-15 MED ORDER — NEOMYCIN-POLYMYXIN-HC 3.5-10000-1 OT SUSP: 3.0000 [drp] | Freq: Four times a day (QID) | OTIC | 0 refills | Status: AC

## 2020-03-15 MED ORDER — NAPROXEN 375 MG PO TABS: 375.0000 mg | ORAL_TABLET | Freq: Two times a day (BID) | ORAL | 0 refills | Status: AC

## 2020-03-15 MED ORDER — NEOMYCIN-POLYMYXIN-HC 3.5-10000-1 OT SUSP: 3.0000 [drp] | Freq: Four times a day (QID) | OTIC | 0 refills | Status: DC

## 2020-03-15 MED ORDER — NAPROXEN 250 MG PO TABS
500.0000 mg | ORAL_TABLET | ORAL | Status: AC
  Administered 2020-03-15: 02:00:00 500 mg via ORAL
  Filled 2020-03-15: qty 2

## 2020-03-15 MED ORDER — ACETAMINOPHEN 500 MG PO TABS
1000.0000 mg | ORAL_TABLET | Freq: Once | ORAL | Status: AC
  Administered 2020-03-15: 1000 mg via ORAL
  Filled 2020-03-15: qty 2

## 2020-03-15 MED ORDER — LIDOCAINE 5 % EX PTCH: 1.0000 | MEDICATED_PATCH | CUTANEOUS | 0 refills | Status: AC
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
## 2023-02-11 ENCOUNTER — Emergency Department (HOSPITAL_COMMUNITY)
Admission: EM | Admit: 2023-02-11 | Discharge: 2023-02-11 | Disposition: A | Discharge status: Home / Self Care | Payer: Medicaid Other | Attending: Emergency Medicine | Admitting: Emergency Medicine

## 2023-02-11 ENCOUNTER — Encounter (HOSPITAL_COMMUNITY): Payer: Self-pay | Admitting: Emergency Medicine

## 2023-02-11 ENCOUNTER — Other Ambulatory Visit: Payer: Self-pay

## 2023-02-11 DIAGNOSIS — F10120 Alcohol abuse with intoxication, uncomplicated: Secondary | ICD-10-CM | POA: Diagnosis present

## 2023-02-11 DIAGNOSIS — Y908 Blood alcohol level of 240 mg/100 ml or more: Secondary | ICD-10-CM | POA: Diagnosis not present

## 2023-02-11 DIAGNOSIS — D72829 Elevated white blood cell count, unspecified: Secondary | ICD-10-CM | POA: Insufficient documentation

## 2023-02-11 DIAGNOSIS — E876 Hypokalemia: Secondary | ICD-10-CM | POA: Insufficient documentation

## 2023-02-11 DIAGNOSIS — T50901A Poisoning by unspecified drugs, medicaments and biological substances, accidental (unintentional), initial encounter: Secondary | ICD-10-CM

## 2023-02-11 DIAGNOSIS — F10920 Alcohol use, unspecified with intoxication, uncomplicated: Secondary | ICD-10-CM

## 2023-02-11 LAB — COMPREHENSIVE METABOLIC PANEL
ALT: 66 U/L — ABNORMAL HIGH (ref 0–44)
AST: 104 U/L — ABNORMAL HIGH (ref 15–41)
Albumin: 4.3 g/dL (ref 3.5–5.0)
Alkaline Phosphatase: 67 U/L (ref 38–126)
Anion gap: 12 (ref 5–15)
BUN: 9 mg/dL (ref 6–20)
CO2: 23 mmol/L (ref 22–32)
Calcium: 8.4 mg/dL — ABNORMAL LOW (ref 8.9–10.3)
Chloride: 107 mmol/L (ref 98–111)
Creatinine, Ser: 0.74 mg/dL (ref 0.44–1.00)
GFR, Estimated: >60 mL/min (ref >60)
Glucose, Bld: 132 mg/dL — ABNORMAL HIGH (ref 70–99)
Potassium: 2.9 mmol/L — ABNORMAL LOW (ref 3.5–5.1)
Sodium: 142 mmol/L (ref 135–145)
Total Bilirubin: 0.4 mg/dL (ref 0.3–1.2)
Total Protein: 7.4 g/dL (ref 6.5–8.1)

## 2023-02-11 LAB — ACETAMINOPHEN LEVEL: Acetaminophen (Tylenol), Serum: <10 ug/mL — ABNORMAL LOW (ref 10–30)

## 2023-02-11 LAB — CBC
HCT: 42.7 % (ref 36.0–46.0)
Hemoglobin: 14.5 g/dL (ref 12.0–15.0)
MCH: 32.2 pg (ref 26.0–34.0)
MCHC: 34 g/dL (ref 30.0–36.0)
MCV: 94.9 fL (ref 80.0–100.0)
Platelets: 326 10*3/uL (ref 150–400)
RBC: 4.5 MIL/uL (ref 3.87–5.11)
RDW: 11.7 % (ref 11.5–15.5)
WBC: 12.7 10*3/uL — ABNORMAL HIGH (ref 4.0–10.5)
nRBC: 0 % (ref 0.0–0.2)

## 2023-02-11 LAB — RAPID URINE DRUG SCREEN, HOSP PERFORMED
Amphetamines: NOT DETECTED
Barbiturates: NOT DETECTED
Benzodiazepines: NOT DETECTED
Cocaine: NOT DETECTED
Opiates: NOT DETECTED
Tetrahydrocannabinol: NOT DETECTED

## 2023-02-11 LAB — ETHANOL: Alcohol, Ethyl (B): 271 mg/dL — ABNORMAL HIGH (ref <10)

## 2023-02-11 LAB — SALICYLATE LEVEL: Salicylate Lvl: <7 mg/dL — ABNORMAL LOW (ref 7.0–30.0)

## 2023-02-11 LAB — PREGNANCY, URINE: Preg Test, Ur: NEGATIVE

## 2023-02-11 LAB — CBG MONITORING, ED: Glucose-Capillary: 111 mg/dL — ABNORMAL HIGH (ref 70–99)

## 2023-02-11 MED ORDER — POTASSIUM CHLORIDE CRYS ER 20 MEQ PO TBCR: 20.0000 meq | EXTENDED_RELEASE_TABLET | Freq: Two times a day (BID) | ORAL | 0 refills | Status: AC

## 2023-02-11 MED ORDER — SODIUM CHLORIDE 0.9 % IV BOLUS
1000.0000 mL | Freq: Once | INTRAVENOUS | Status: AC
  Administered 2023-02-11: 1000 mL via INTRAVENOUS

## 2023-02-11 MED ORDER — LACTATED RINGERS IV BOLUS: 1000.0000 mL | Freq: Once | INTRAVENOUS | Status: DC

## 2023-02-11 NOTE — ED Notes (Signed)
Pt ambulatory w/o staff assistance, pt maintains steady and equal gait w/ ambulation. Pt denies any acute shortness of breath, dizziness, weakness.
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
## 2023-02-11 NOTE — Discharge Instructions (Addendum)
You were seen in the ER today for your intoxication and possible overdose. Your blood work and physical exam were reassuring. Please follow up with your PCP and return to the ER with any new severe symptoms.   You did have low potassium.  Please take the prescribed potassium supplement for the next 3 days. Follow up with your PCP for recheck of your potassium in 1 week.

## 2023-02-11 NOTE — ED Notes (Signed)
Called lab in regards to pt labs, they said they are currently running them.

## 2023-02-11 NOTE — ED Notes (Signed)
Pt fully awake, A&Ox4, tolerating PO fluids and crackers w/o issue.
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
## 2023-02-11 NOTE — ED Notes (Signed)
Patient was found in the hallway yelling at primary nurse, and MD. Writer and another nurse was able to calm the patient down, and get her back into her bed. Placed a IV, and pt. Is back on monitoring. Seems calmer at this time.

## 2023-06-03 ENCOUNTER — Emergency Department (HOSPITAL_COMMUNITY): Payer: BC Managed Care – PPO

## 2023-06-03 ENCOUNTER — Emergency Department (HOSPITAL_COMMUNITY)
Admission: EM | Admit: 2023-06-03 | Discharge: 2023-06-03 | Disposition: A | Discharge status: Home / Self Care | Payer: BC Managed Care – PPO | Attending: Emergency Medicine | Admitting: Emergency Medicine

## 2023-06-03 ENCOUNTER — Encounter (HOSPITAL_COMMUNITY): Payer: Self-pay

## 2023-06-03 ENCOUNTER — Other Ambulatory Visit: Payer: Self-pay

## 2023-06-03 DIAGNOSIS — R42 Dizziness and giddiness: Secondary | ICD-10-CM | POA: Diagnosis present

## 2023-06-03 DIAGNOSIS — N939 Abnormal uterine and vaginal bleeding, unspecified: Secondary | ICD-10-CM

## 2023-06-03 DIAGNOSIS — R109 Unspecified abdominal pain: Secondary | ICD-10-CM | POA: Diagnosis not present

## 2023-06-03 LAB — COMPREHENSIVE METABOLIC PANEL
ALT: 19 U/L (ref 0–44)
AST: 27 U/L (ref 15–41)
Albumin: 3.4 g/dL — ABNORMAL LOW (ref 3.5–5.0)
Alkaline Phosphatase: 56 U/L (ref 38–126)
Anion gap: 13 (ref 5–15)
BUN: 6 mg/dL (ref 6–20)
CO2: 19 mmol/L — ABNORMAL LOW (ref 22–32)
Calcium: 8.6 mg/dL — ABNORMAL LOW (ref 8.9–10.3)
Chloride: 106 mmol/L (ref 98–111)
Creatinine, Ser: 0.83 mg/dL (ref 0.44–1.00)
GFR, Estimated: >60 mL/min (ref >60)
Glucose, Bld: 91 mg/dL (ref 70–99)
Potassium: 3.9 mmol/L (ref 3.5–5.1)
Sodium: 138 mmol/L (ref 135–145)
Total Bilirubin: 0.4 mg/dL (ref 0.3–1.2)
Total Protein: 6.5 g/dL (ref 6.5–8.1)

## 2023-06-03 LAB — TYPE AND SCREEN
ABO/RH(D): O POS
Antibody Screen: NEGATIVE

## 2023-06-03 LAB — CBC WITH DIFFERENTIAL/PLATELET
Abs Immature Granulocytes: 0.01 10*3/uL (ref 0.00–0.07)
Basophils Absolute: 0 10*3/uL (ref 0.0–0.1)
Basophils Relative: 1 %
Eosinophils Absolute: 0.1 10*3/uL (ref 0.0–0.5)
Eosinophils Relative: 1 %
HCT: 32.3 % — ABNORMAL LOW (ref 36.0–46.0)
Hemoglobin: 9.5 g/dL — ABNORMAL LOW (ref 12.0–15.0)
Immature Granulocytes: 0 %
Lymphocytes Relative: 27 %
Lymphs Abs: 2.2 10*3/uL (ref 0.7–4.0)
MCH: 23.9 pg — ABNORMAL LOW (ref 26.0–34.0)
MCHC: 29.4 g/dL — ABNORMAL LOW (ref 30.0–36.0)
MCV: 81.4 fL (ref 80.0–100.0)
Monocytes Absolute: 0.5 10*3/uL (ref 0.1–1.0)
Monocytes Relative: 6 %
Neutro Abs: 5.3 10*3/uL (ref 1.7–7.7)
Neutrophils Relative %: 65 %
Platelets: 419 10*3/uL — ABNORMAL HIGH (ref 150–400)
RBC: 3.97 MIL/uL (ref 3.87–5.11)
RDW: 15.3 % (ref 11.5–15.5)
WBC: 8.1 10*3/uL (ref 4.0–10.5)
nRBC: 0 % (ref 0.0–0.2)

## 2023-06-03 LAB — I-STAT BETA HCG BLOOD, ED (MC, WL, AP ONLY): I-stat hCG, quantitative: <5 m[IU]/mL (ref <5)

## 2023-06-03 LAB — D-DIMER, QUANTITATIVE: D-Dimer, Quant: 0.35 ug/mL-FEU (ref 0.00–0.50)

## 2023-06-03 NOTE — ED Notes (Signed)
Pt in [place]

## 2023-06-03 NOTE — ED Provider Notes (Signed)
[place] EMERGENCY DEPARTMENT AT [facility] Provider Note   CSN: [id number] Arrival date & time: 06/03/23  1036     History  Chief Complaint  Patient presents with   Dizziness   Vaginal Bleeding    Kathy Hill is a 32 y.o. female.  32 year old female with a past medical history of anemia presents to the ED with a chief complaint of syncope.  Patient reports she was getting out of the shower, when she felt the urge to go have a bowel movement, states she has had a little had a bowel movement when she tried to get up from the toilet she proceeded to pass out.  She reports being unconscious for approximately 3 minutes which is what her 33 year old told her. She did lose consciousness.  She reports she has had some severe vaginal bleeding for the past 4 days, passing heavy bright red clots.  She has been changing 1 pad every 1-2 hours.  Her last menstrual cycle was on May 16, 2023, reports she began bleeding again on Tuesday.  She is also endorsing some lower abdominal cramping that is been ongoing since the bleeding began.  She has not taking any medication for improvement in symptoms.  She is unsure whether there is a risk of pregnancy.  No history of bleeding disorder, no prior transfusion, no urinary symptoms.  The history is provided by the patient.  Dizziness Associated symptoms: no chest pain, no nausea, no shortness of breath and no vomiting   Vaginal Bleeding Associated symptoms: abdominal pain and dizziness   Associated symptoms: no fever and no nausea        Home Medications Prior to Admission medications   Medication Sig Start Date End Date Taking? Authorizing Provider  famotidine (PEPCID) 20 MG tablet Take 1 tablet (20 mg total) by mouth 2 (two) times daily. Patient not taking: Reported on 06/18/2018 04/03/18   Katrinka Blazing, [place], CNM  ibuprofen (ADVIL,MOTRIN) 600 MG tablet Take 1 tablet (600 mg total) by mouth every 6 (six) hours as needed for mild pain,  moderate pain or cramping. Patient not taking: Reported on 02/11/2023 06/28/18   Janeece Riggers, CNM  lidocaine (LIDODERM) 5 % Place 1 patch onto the skin daily. Remove & Discard patch within 12 hours or as directed by MD Patient not taking: Reported on 02/11/2023 03/15/20   Palumbo, April, MD  lisdexamfetamine (VYVANSE) 20 MG capsule Take 20 mg by mouth daily.    [provider]  naproxen (NAPROSYN) 375 MG tablet Take 1 tablet (375 mg total) by mouth 2 (two) times daily. Patient not taking: Reported on 02/11/2023 03/15/20   Palumbo, April, MD  neomycin-polymyxin-hydrocortisone (CORTISPORIN) 3.5-10000-1 OTIC suspension Place 3 drops into the left ear 4 (four) times daily. X 7 days Patient not taking: Reported on 02/11/2023 03/15/20   Palumbo, April, MD  NIFEdipine (PROCARDIA-XL/ADALAT CC) 30 MG 24 hr tablet Take 1 tablet (30 mg total) by mouth daily. Patient not taking: Reported on 02/11/2023 06/28/18   Janeece Riggers, CNM  oxyCODONE-acetaminophen (PERCOCET/ROXICET) 5-325 MG tablet Take 1 tablet by mouth every 6 (six) hours as needed for severe pain. Patient not taking: Reported on 02/11/2023 06/28/18   Janeece Riggers, CNM  Pediatric Multiple Vitamins (FLINSTONES GUMMIES OMEGA-3 DHA PO) Take 1 tablet by mouth daily.    [provider]  potassium chloride SA (KLOR-CON M) 20 MEQ tablet Take 1 tablet (20 mEq total) by mouth 2 (two) times daily for 3 days. 02/11/23 02/14/23  Sponseller, Eugene Gavia, PA-C      Allergies    Patient has no known allergies.    Review of Systems   Review of Systems  Constitutional:  Negative for chills and fever.  HENT:  Negative for sore throat.   Respiratory:  Negative for shortness of breath.   Cardiovascular:  Negative for chest pain.  Gastrointestinal:  Positive for abdominal pain. Negative for nausea and vomiting.  Genitourinary:  Positive for vaginal bleeding.  Neurological:  Positive for dizziness.  All other systems reviewed and are  negative.   Physical Exam Updated Vital Signs BP 109/77   Pulse 76   Temp 98.1 F (36.7 C) (Oral)   Resp 13   Ht 5\' 6"  (1.676 m)   Wt 90.3 kg   LMP 05/16/2023 (Exact Date)   SpO2 100%   BMI 32.12 kg/m  Physical Exam Vitals and nursing note reviewed.  Constitutional:      General: She is not in acute distress.    Appearance: She is well-developed.  HENT:     Head: Normocephalic and atraumatic.     Mouth/Throat:     Pharynx: No oropharyngeal exudate.  Eyes:     Pupils: Pupils are equal, round, and reactive to light.  Cardiovascular:     Rate and Rhythm: Regular rhythm.     Heart sounds: Normal heart sounds.  Pulmonary:     Effort: Pulmonary effort is normal. No respiratory distress.     Breath sounds: Normal breath sounds.  Abdominal:     General: Bowel sounds are normal. There is no distension.     Palpations: Abdomen is soft.     Tenderness: There is no abdominal tenderness.  Musculoskeletal:        General: No tenderness or deformity.     Cervical back: Normal range of motion.     Right lower leg: No edema.     Left lower leg: No edema.  Skin:    General: Skin is warm and dry.  Neurological:     Mental Status: She is alert and oriented to person, place, and time.     ED Results / Procedures / Treatments   Labs (all labs ordered are listed, but only abnormal results are displayed) Labs Reviewed  CBC WITH DIFFERENTIAL/PLATELET - Abnormal; Notable for the following components:      Result Value   Hemoglobin 9.5 (*)    HCT 32.3 (*)    MCH 23.9 (*)    MCHC 29.4 (*)    Platelets 419 (*)    All other components within normal limits  COMPREHENSIVE METABOLIC PANEL - Abnormal; Notable for the following components:   CO2 19 (*)    Calcium 8.6 (*)    Albumin 3.4 (*)    All other components within normal limits  D-DIMER, QUANTITATIVE  I-STAT BETA HCG BLOOD, ED ([facility], [facility], [facility] ONLY)  TYPE AND SCREEN    EKG None  Radiology US Pelvis Complete  Result Date:  06/03/2023 CLINICAL DATA:  Vaginal bleeding. EXAM: TRANSABDOMINAL ULTRASOUND OF PELVIS DOPPLER ULTRASOUND OF OVARIES TECHNIQUE: Transabdominal ultrasound examination of the pelvis was performed including evaluation of the uterus, ovaries, adnexal regions, and pelvic cul-de-sac. Color and duplex Doppler ultrasound was utilized to evaluate blood flow to the ovaries. COMPARISON:  09/11/2017 FINDINGS: Uterus Measurements: 9.3 x 3.6 x 4.2 cm = volume: 7.3 mL. No fibroids or other mass visualized. Endometrium Thickness: 2 mm in thickness.  No focal abnormality visualized. Right ovary Measurements: 2.4 x 1.4 x  2.1 cm = volume: 3.6 mL. Normal appearance/no adnexal mass. Left ovary Measurements: 3.8 x 2.7 x 2.8 cm = volume: 15.3 mL. 2.2 cm simple cyst or dominant follicle. No adnexal mass. Pulsed Doppler evaluation demonstrates normal low-resistance arterial and venous waveforms in both ovaries. Other: No free fluid.  Patient refused transvaginal imaging. IMPRESSION: No acute findings or significant abnormality. Electronically Signed   By: Charlett Nose M.D.   On: 06/03/2023 13:22   [place] Art/Ven Flow Abd Pelv Doppler  Result Date: 06/03/2023 CLINICAL DATA:  Vaginal bleeding. EXAM: TRANSABDOMINAL ULTRASOUND OF PELVIS DOPPLER ULTRASOUND OF OVARIES TECHNIQUE: Transabdominal ultrasound examination of the pelvis was performed including evaluation of the uterus, ovaries, adnexal regions, and pelvic cul-de-sac. Color and duplex Doppler ultrasound was utilized to evaluate blood flow to the ovaries. COMPARISON:  09/11/2017 FINDINGS: Uterus Measurements: 9.3 x 3.6 x 4.2 cm = volume: 7.3 mL. No fibroids or other mass visualized. Endometrium Thickness: 2 mm in thickness.  No focal abnormality visualized. Right ovary Measurements: 2.4 x 1.4 x 2.1 cm = volume: 3.6 mL. Normal appearance/no adnexal mass. Left ovary Measurements: 3.8 x 2.7 x 2.8 cm = volume: 15.3 mL. 2.2 cm simple cyst or dominant follicle. No adnexal mass. Pulsed Doppler  evaluation demonstrates normal low-resistance arterial and venous waveforms in both ovaries. Other: No free fluid.  Patient refused transvaginal imaging. IMPRESSION: No acute findings or significant abnormality. Electronically Signed   By: Charlett Nose M.D.   On: 06/03/2023 13:22    Procedures Procedures    Medications Ordered in ED Medications - No data to display  ED Course/ Medical Decision Making/ A&P Clinical Course as of 06/03/23 1359  Sat Jun 03, 2023  1334 D-Dimer, Quant: 0.35 [JS]    Clinical Course User Index [JS] Claude Manges, PA-C                             Medical Decision Making Amount and/or Complexity of Data Reviewed Labs: ordered. Decision-making details documented in ED Course. Radiology: ordered.   This patient presents to the ED for concern of vaginal bleeding, dizziness, this involves a number of treatment options, and is a complaint that carries with it a high risk of complications and morbidity.  The differential diagnosis includes ectopic, AUB versus infection.    Co morbidities: Discussed in HPI   Brief History:  See HPI.   EMR reviewed including pt PMHx, past surgical history and past visits to ER.   See HPI for more details   Lab Tests:  I ordered and independently interpreted labs.  The pertinent results include:    I personally reviewed all laboratory work and imaging. Metabolic panel without any acute abnormality specifically kidney function within normal limits and no significant electrolyte abnormalities. CBC without leukocytosis or significant anemia.   Imaging Studies:  NAD. I personally reviewed all imaging studies and no acute abnormality found. I agree with radiology interpretation.  Reevaluation:  After the interventions noted above I re-evaluated patient and found that they have :resolved   Social Determinants of Health:  The patient's social determinants of health were a factor in the care of this  patient   Problem List / ED Course:  Patient presented to the ED with a chief complaint of dizziness, syncopal episode after taking a shower today noticed and she had some heavy bleeding with 1 pad every hour.  She reports a prior history of Depo-Provera for birth control however has not had  any more birth control after that.  She shows no concern for pregnancy her last menstrual cycle was last month on the 21st, she began bleeding again approximately on Tuesday.  Her blood work today with a CBC that is unremarkable, hemoglobin is low however this is a prior history of anemia.  CMP with no electrolyte derangement, creatinine levels normal.  LFTs are within normal limits.  Pregnancy test is also negative, therefore low suspicion for ectopic.  Patient's vitals are within normal limits, she is not having any chest pain, however did have short of breath prior to her syncopal episode, consider pulmonary embolism, obtain D-dimer which was negative on today's visit.  I did offer a pelvic exam to patient, however she reports no concern for sexually transmitted infection, she did have an ultrasound which showed no acute abnormalities to her GU area. Unfortunately, due to patient being placed on hallway bed, we were unable to obtain pelvic on today's visit, however patient does not feel that she needs this at this time, and I do feel that with a stable hemoglobin, low with no concern for sexually transmitted infection and normal ultrasound she is appropriate for outpatient follow-up.  Patient is agreeable to plan treatment.  Return precautions discussed at length, patient stable for discharge.  Dispostion:  After consideration of the diagnostic results and the patients response to treatment, I feel that the patent would benefit from follow up with primary care physician.    Portions of this note were generated with Scientist, clinical (histocompatibility and immunogenetics). Dictation errors may occur despite best attempts at proofreading.    Final Clinical Impression(s) / ED Diagnoses Final diagnoses:  Dizziness  Vaginal bleeding    Rx / DC Orders ED Discharge Orders     None         Claude Manges, PA-C 06/03/23 1359    Rexford Maus, DO 06/03/23 1535

## 2023-06-03 NOTE — ED Triage Notes (Signed)
Pt here for dizziness and vag bleeding. Pt states passing big blood clots every 15 min. Pt states changing pad every 1-2 hours. Hx of anemia. Axox4. VSS. Last menstrual cycle was May 21st.

## 2023-06-03 NOTE — Discharge Instructions (Signed)
Your laboratory results are within normal limits today.  Please follow-up with your gynecologist for your ongoing vaginal bleeding.

## 2023-06-03 NOTE — ED Notes (Signed)
Patient Alert and oriented to baseline. Stable and ambulatory to baseline. Patient verbalized understanding of the discharge instructions.  Patient belongings were taken by the patient.   

## 2023-06-03 NOTE — ED Notes (Signed)
Got patient on the monitor into a gown patient is resting with call bell in reach  

## 2023-10-17 ENCOUNTER — Emergency Department (HOSPITAL_COMMUNITY)
Admission: EM | Admit: 2023-10-17 | Discharge: 2023-10-17 | Disposition: A | Discharge status: Home / Self Care | Payer: BC Managed Care – PPO | Attending: Emergency Medicine | Admitting: Emergency Medicine

## 2023-10-17 ENCOUNTER — Encounter (HOSPITAL_COMMUNITY): Payer: Self-pay

## 2023-10-17 ENCOUNTER — Other Ambulatory Visit: Payer: Self-pay

## 2023-10-17 DIAGNOSIS — T1592XA Foreign body on external eye, part unspecified, left eye, initial encounter: Secondary | ICD-10-CM

## 2023-10-17 DIAGNOSIS — W294XXA Contact with nail gun, initial encounter: Secondary | ICD-10-CM | POA: Diagnosis not present

## 2023-10-17 DIAGNOSIS — T1502XA Foreign body in cornea, left eye, initial encounter: Secondary | ICD-10-CM | POA: Diagnosis present

## 2023-10-17 MED ORDER — ERYTHROMYCIN 5 MG/GM OP OINT: TOPICAL_OINTMENT | OPHTHALMIC | 0 refills | Status: AC

## 2023-10-17 NOTE — ED Triage Notes (Signed)
Pt got nail glue in her eye at 1430. Nail glue melted contact lens but pt was able to get whole contact lens off. Pt feels like some glue is still in eye. Left eye is red and burns. Vision seems normal for the most part however pt is not wearing contact so she can  not really tell.

## 2023-10-17 NOTE — Discharge Instructions (Signed)
Apply erythromycin ointment to your eye.  Recheck with your eye doctor.

## 2023-10-17 NOTE — ED Provider Notes (Signed)
[place] EMERGENCY DEPARTMENT AT [facility] Provider Note   CSN: [id number] Arrival date & time: 10/17/23  1543     History  Chief Complaint  Patient presents with   Foreign Body in Eye    Nail glue    Kathy Hill is a 32 y.o. female.  32 year old female presents with complaint of nail glue to her right upper eyelid.  Patient states that she put her nail back on and then put the glue down however some of the glue flew up and hit her in the eye.  Patient was able to remove her contact lenses, has been holding her lid open since this happened.  No other complaints or concerns.       Home Medications Prior to Admission medications   Medication Sig Start Date End Date Taking? Authorizing Provider  erythromycin ophthalmic ointment Place a 1/2 inch ribbon of ointment into the lower eyelid. 10/17/23  Yes Jeannie Fend, PA-C  famotidine (PEPCID) 20 MG tablet Take 1 tablet (20 mg total) by mouth 2 (two) times daily. Patient not taking: Reported on 06/18/2018 04/03/18   Katrinka Blazing, [place], CNM  ibuprofen (ADVIL,MOTRIN) 600 MG tablet Take 1 tablet (600 mg total) by mouth every 6 (six) hours as needed for mild pain, moderate pain or cramping. Patient not taking: Reported on 02/11/2023 06/28/18   Janeece Riggers, CNM  lidocaine (LIDODERM) 5 % Place 1 patch onto the skin daily. Remove & Discard patch within 12 hours or as directed by MD Patient not taking: Reported on 02/11/2023 03/15/20   Palumbo, April, MD  lisdexamfetamine (VYVANSE) 20 MG capsule Take 20 mg by mouth daily.    [provider]  naproxen (NAPROSYN) 375 MG tablet Take 1 tablet (375 mg total) by mouth 2 (two) times daily. Patient not taking: Reported on 02/11/2023 03/15/20   Palumbo, April, MD  neomycin-polymyxin-hydrocortisone (CORTISPORIN) 3.5-10000-1 OTIC suspension Place 3 drops into the left ear 4 (four) times daily. X 7 days Patient not taking: Reported on 02/11/2023 03/15/20   Palumbo, April, MD   NIFEdipine (PROCARDIA-XL/ADALAT CC) 30 MG 24 hr tablet Take 1 tablet (30 mg total) by mouth daily. Patient not taking: Reported on 02/11/2023 06/28/18   Janeece Riggers, CNM  oxyCODONE-acetaminophen (PERCOCET/ROXICET) 5-325 MG tablet Take 1 tablet by mouth every 6 (six) hours as needed for severe pain. Patient not taking: Reported on 02/11/2023 06/28/18   Janeece Riggers, CNM  Pediatric Multiple Vitamins (FLINSTONES GUMMIES OMEGA-3 DHA PO) Take 1 tablet by mouth daily.    [provider]  potassium chloride SA (KLOR-CON M) 20 MEQ tablet Take 1 tablet (20 mEq total) by mouth 2 (two) times daily for 3 days. 02/11/23 02/14/23  Sponseller, Eugene Gavia, PA-C      Allergies    Patient has no known allergies.    Review of Systems   Review of Systems Negative except as per HPI Physical Exam Updated Vital Signs BP (!) 136/94 (BP Location: Right Arm)   Pulse 87   Temp 98.5 F (36.9 C) (Oral)   Resp 16   Ht 5\' 6"  (1.676 m)   Wt 86.2 kg   SpO2 100%   BMI 30.67 kg/m  Physical Exam Vitals and nursing note reviewed.  Constitutional:      General: She is not in acute distress.    Appearance: She is well-developed. She is not diaphoretic.  HENT:     Head: Normocephalic and atraumatic.  Eyes:     Extraocular Movements:  Extraocular movements intact.     Conjunctiva/sclera: Conjunctivae normal.     Pupils: Pupils are equal, round, and reactive to light.     Comments: Approximately 2 to 3 mm area of likely nail glue identified in the left upper eyelid.  Pulmonary:     Effort: Pulmonary effort is normal.  Skin:    General: Skin is warm and dry.     Findings: No erythema or rash.  Neurological:     Mental Status: She is alert and oriented to person, place, and time.  Psychiatric:        Behavior: Behavior normal.     ED Results / Procedures / Treatments   Labs (all labs ordered are listed, but only abnormal results are displayed) Labs Reviewed - No data to  display  EKG None  Radiology No results found.  Procedures Procedures    Medications Ordered in ED Medications - No data to display  ED Course/ Medical Decision Making/ A&P                                 Medical Decision Making  32 year old female with nail glue to left upper eyelid.  She was able to remove her contact lens without difficulty.  No visual changes.  Eye is not glued shut.  Patient is provided with erythromycin eye ointment to help remove the nail glue.  Recommend recheck with her eye doctor in 2 days.        Final Clinical Impression(s) / ED Diagnoses Final diagnoses:  Foreign body of left eye, initial encounter    Rx / DC Orders ED Discharge Orders          Ordered    erythromycin ophthalmic ointment        10/17/23 1624              Jeannie Fend, PA-C 10/17/23 1644    Anders Simmonds T, DO 10/17/23 1649

## 2024-10-08 ENCOUNTER — Other Ambulatory Visit (HOSPITAL_BASED_OUTPATIENT_CLINIC_OR_DEPARTMENT_OTHER): Payer: Self-pay

## 2024-10-08 ENCOUNTER — Other Ambulatory Visit: Payer: Self-pay

## 2024-10-08 ENCOUNTER — Encounter (HOSPITAL_BASED_OUTPATIENT_CLINIC_OR_DEPARTMENT_OTHER): Payer: Self-pay | Admitting: *Deleted

## 2024-10-08 ENCOUNTER — Emergency Department (HOSPITAL_BASED_OUTPATIENT_CLINIC_OR_DEPARTMENT_OTHER)
Admission: EM | Admit: 2024-10-08 | Discharge: 2024-10-08 | Disposition: A | Discharge status: Home / Self Care | Attending: Emergency Medicine | Admitting: Emergency Medicine

## 2024-10-08 DIAGNOSIS — M25552 Pain in left hip: Secondary | ICD-10-CM | POA: Insufficient documentation

## 2024-10-08 MED ORDER — KETOROLAC TROMETHAMINE 30 MG/ML IJ SOLN
30.0000 mg | Freq: Once | INTRAMUSCULAR | Status: AC
  Administered 2024-10-08: 30 mg via INTRAMUSCULAR
  Filled 2024-10-08: qty 1

## 2024-10-08 MED ORDER — LIDOCAINE 5 % EX PTCH
1.0000 | MEDICATED_PATCH | CUTANEOUS | Status: DC
  Administered 2024-10-08: 1 via TRANSDERMAL
  Filled 2024-10-08: qty 1

## 2024-10-08 MED ORDER — LIDOCAINE 5 % EX PTCH: 1.0000 | MEDICATED_PATCH | CUTANEOUS | Status: DC

## 2024-10-08 MED ORDER — IBUPROFEN 800 MG PO TABS
800.0000 mg | ORAL_TABLET | Freq: Three times a day (TID) | ORAL | 0 refills | Status: DC
  Filled 2024-10-08: qty 21, 7d supply, fill #0

## 2024-10-08 NOTE — ED Triage Notes (Signed)
 Pt is here for left hip pain with pain radiating down her left leg.  Pain began about two weeks ago and has been increasing.  Not associated with any trauma.

## 2024-10-08 NOTE — Discharge Instructions (Signed)
 Given your history and physical exam findings you most likely have sciatica. I am sending you home with 800mg  ibuprofen  that can be taken every 8hrs as needed for pain relief and lidoderm  patches that can be used every 12 hours as needed for pain relief. I also recommenced ice/heat therapy and rest as needed.

## 2024-10-08 NOTE — ED Provider Notes (Signed)
 [place] EMERGENCY DEPARTMENT AT [facility] Provider Note   CSN: [id number] Arrival date & time: 10/08/24  1256     Patient presents with: Hip Pain   Saxon D Querry is a 33 y.o. female who is seen at this emergency department today for hip pain x two weeks. Patient reports that approximately two weeks ago she began having pain that spreads proximately from her left lower gluteal muscle to her left foot. Patient denies any known causative factors to the extremity or recent trauma. Patient states that pain occurs more on movement and is frequently associated with some numbness and tingling that is wax/wane in nature. She states that this pain has made walking small distances difficult. Patient states that pain subsides at rest in a position of comfort. Patient does endorse a history of chronic back pain that is currently managed by her PCP however states that this pain is different from what she has experienced in the past. Patient has taken OTC analgesic medications, prescribed muscle relaxers, heat, and stretching without relief. Patient denies any urinary changes or difficulty, difficulty with bowel movements, abnormal vaginal bleeding or discharge, abdominal pain, or increased loss of feeling at rest. Patient did relay that she is sexually active and occasionally will try different positions with her partner however does not believe this is a contributing factor.      Hip Pain Pertinent negatives include no abdominal pain.      Prior to Admission medications   Medication Sig Start Date End Date Taking? Authorizing Provider  ibuprofen  (ADVIL ) 800 MG tablet Take 1 tablet (800 mg total) by mouth 3 (three) times daily. 10/08/24  Yes Cambell Stanek L, PA  erythromycin  ophthalmic ointment Place a 1/2 inch ribbon of ointment into the lower eyelid. 10/17/23   Beverley Leita LABOR, PA-C  famotidine  (PEPCID ) 20 MG tablet Take 1 tablet (20 mg total) by mouth 2 (two) times daily. Patient  not taking: Reported on 06/18/2018 04/03/18   Claudene, Virginia , CNM  lidocaine  (LIDODERM ) 5 % Place 1 patch onto the skin daily. Remove & Discard patch within 12 hours or as directed by MD Patient not taking: Reported on 02/11/2023 03/15/20   Palumbo, April, MD  lisdexamfetamine (VYVANSE) 20 MG capsule Take 20 mg by mouth daily.    [provider]  naproxen  (NAPROSYN ) 375 MG tablet Take 1 tablet (375 mg total) by mouth 2 (two) times daily. Patient not taking: Reported on 02/11/2023 03/15/20   Palumbo, April, MD  neomycin -polymyxin-hydrocortisone (CORTISPORIN) 3.5-10000-1 OTIC suspension Place 3 drops into the left ear 4 (four) times daily. X 7 days Patient not taking: Reported on 02/11/2023 03/15/20   Palumbo, April, MD  NIFEdipine  (PROCARDIA -XL/ADALAT  CC) 30 MG 24 hr tablet Take 1 tablet (30 mg total) by mouth daily. Patient not taking: Reported on 02/11/2023 06/28/18   Jestine Alto POUR, CNM  oxyCODONE -acetaminophen  (PERCOCET/ROXICET) 5-325 MG tablet Take 1 tablet by mouth every 6 (six) hours as needed for severe pain. Patient not taking: Reported on 02/11/2023 06/28/18   Jestine Alto POUR, CNM  Pediatric Multiple Vitamins (FLINSTONES GUMMIES OMEGA-3 DHA PO) Take 1 tablet by mouth daily.    [provider]  potassium chloride  SA (KLOR-CON  M) 20 MEQ tablet Take 1 tablet (20 mEq total) by mouth 2 (two) times daily for 3 days. 02/11/23 02/14/23  Sponseller, Pleasant SAUNDERS, PA-C    Allergies: Patient has no known allergies.    Review of Systems  Gastrointestinal:  Negative for abdominal pain, blood in stool, constipation and  diarrhea.  Genitourinary:  Negative for decreased urine volume, difficulty urinating, dyspareunia, flank pain, frequency, menstrual problem, pelvic pain, vaginal bleeding, vaginal discharge and vaginal pain.  Musculoskeletal:  Positive for myalgias (pain in in lower left glue that radiates down the back of her leg to her left foot.). Negative for back pain.  Neurological:  Positive for  numbness (posistional and assocaited with tingling). Negative for weakness.    Updated Vital Signs BP 139/83   Pulse 89   Temp 98.2 F (36.8 C) (Oral)   Resp 18   LMP 09/09/2024 (Approximate)   SpO2 100%   Physical Exam Vitals reviewed.  Constitutional:      General: She is not in acute distress.    Appearance: Normal appearance.  Cardiovascular:     Pulses: Normal pulses.          Popliteal pulses are 2+ on the left side.       Dorsalis pedis pulses are 2+ on the left side.  Abdominal:     General: Bowel sounds are normal. There is no distension.     Tenderness: There is no abdominal tenderness. There is no left CVA tenderness or guarding.  Musculoskeletal:        General: Tenderness present. No swelling or signs of injury. Normal range of motion.     Left lower leg: No edema.     Comments: Patient relayed pain upon movement of left lower extremity and had difficulty holding left leg elevated. Patient was able to point to where she feels the pain when lifting her left lower extremity and she localized the pain to the left glute. Denied any bony tenderness on palpation.   Skin:    General: Skin is warm and dry.     Capillary Refill: Capillary refill takes less than 2 seconds.  Neurological:     Mental Status: She is alert and oriented to person, place, and time.     Motor: No weakness.     Coordination: Coordination normal.  Psychiatric:        Mood and Affect: Mood normal.        Behavior: Behavior normal.    Upon exam patient denied any abdominal pain on palpation  (all labs ordered are listed, but only abnormal results are displayed) Labs Reviewed - No data to display  EKG: None  Radiology: No results found.   Procedures   Medications Ordered in the ED  lidocaine  (LIDODERM ) 5 % 1 patch (1 patch Transdermal Patch Applied 10/08/24 1445)  lidocaine  (LIDODERM ) 5 % 1 patch (1 patch Transdermal Not Given 10/08/24 1603)  ketorolac  (TORADOL ) 30 MG/ML injection 30 mg  (30 mg Intramuscular Given 10/08/24 1442)    33 YOF presents to the ED for concern of left hip pain x 2 weeks, this involves an extensive number of treatment options, and is a complaint that carries with it a high risk of complications and morbidity.  The differential diagnosis includes trauma, coagulopathy, UTI, ovarian cyst. Patient reports that approximately two weeks ago she began having pain that spreads proximately from her left lower gluteal muscle to her left foot. Patient denies any known causative factors to the extremity or recent trauma. Patient states that pain occurs more on movement and is frequently associated with some numbness and tingling that is wax/wane in nature. She states that this pain has made walking small distances difficult. Patient states that pain subsides at rest in a position of comfort. Patient does endorse a history of chronic back pain  that is currently managed by her PCP however states that this pain is different from what she has experienced in the past. Upon exam patient relayed pain upon movement of the left lower extremity. ROM and sensory intact. Patient denied abd pain upon palpation and denied any abnormal bleeding/discharging making ovarian cyst unlikely. Patient denied any urinary pain/urgency/frequency making UTI unlikely. Patient denied any worsening numbness, or temperature changes to the extremity - patient also relays she does not take birth control and denies any smoking history making DVT unlikely. Patient denies any trauma making traumatic injury unlikely.    Co morbidities that complicate the patient evaluation  Obesity   Additional history obtained:  Additional history obtained from  Outside Medical Records   External records from outside source obtained and reviewed including medical history, surgical history, allergies, and medications.    Lab Tests:  No lab tests ordered at this visit.    Imaging Studies ordered:  No lab tests ordered  at this visit.   Cardiac Monitoring:  N/A  Medicines ordered and prescription drug management:  I ordered medication including ketorolac  30mg  IM and lidocaine  patch for left lower gluteal pain.  Reevaluation of the patient after these medicines showed that the patient improved I have reviewed the patients home medicines and have made adjustments as needed. 800mg  Ibuprofen  PRN and Lidocaine  patches PRN prescribed.   Test Considered:  Radiograph images considered however with no associated trauma makes traumatic injury requiring imaging unlikely.  Ultrasound considered however due to history given, exam findings, and no history or comorbid comorbid makes DVT unlikely.    Critical Interventions:  Pain management    Consultations Obtained:  Consultation not required for this visit.    Problem List / ED Course:   Reevaluation:  After the interventions noted above, I reevaluated the patient and found that they have :improved   Social Determinants of Health:   Dispostion:  After consideration of the diagnostic results and the patients response to treatment, I feel that the patent would benefit from a short course of ibuprofen  800mg  PRN for pain and lidocaine  patches PRN for pain. It is reccommended to patient to continue with supportive care measures at home including rest, ice, and heat. It is also relayed to patient to F/U with her PCP and return if her condition worsens.                                    Medical Decision Making Risk Prescription drug management.        Final diagnoses:  Pain of left hip    ED Discharge Orders          Ordered    ibuprofen  (ADVIL ) 800 MG tablet  3 times daily        10/08/24 1553    lidocaine  (LIDODERM ) 5 %  Every 24 hours        Pending               Willma Duwaine CROME, [place] 10/08/24 1800    Jerrol Agent, MD 10/10/24 1645

## 2024-10-18 ENCOUNTER — Other Ambulatory Visit (HOSPITAL_BASED_OUTPATIENT_CLINIC_OR_DEPARTMENT_OTHER): Payer: Self-pay

## 2024-12-29 ENCOUNTER — Ambulatory Visit (INDEPENDENT_AMBULATORY_CARE_PROVIDER_SITE_OTHER)

## 2024-12-29 ENCOUNTER — Ambulatory Visit
Admission: EM | Admit: 2024-12-29 | Discharge: 2024-12-29 | Disposition: A | Discharge status: Home / Self Care | Payer: Self-pay | Attending: Family Medicine | Admitting: Family Medicine

## 2024-12-29 DIAGNOSIS — J3489 Other specified disorders of nose and nasal sinuses: Secondary | ICD-10-CM

## 2024-12-29 DIAGNOSIS — S0081XA Abrasion of other part of head, initial encounter: Secondary | ICD-10-CM

## 2024-12-29 DIAGNOSIS — S0031XA Abrasion of nose, initial encounter: Secondary | ICD-10-CM

## 2024-12-29 DIAGNOSIS — R519 Headache, unspecified: Secondary | ICD-10-CM

## 2024-12-29 MED ORDER — IBUPROFEN 800 MG PO TABS: 800.0000 mg | ORAL_TABLET | Freq: Three times a day (TID) | ORAL | 0 refills | Status: AC

## 2024-12-29 MED ORDER — MUPIROCIN 2 % EX OINT: 1.0000 | TOPICAL_OINTMENT | Freq: Three times a day (TID) | CUTANEOUS | 0 refills | Status: AC
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
## 2024-12-29 NOTE — Discharge Instructions (Addendum)
 Will update you later today with your x-ray results. For now use ibuprofen  for pain and inflammation. Use Bactroban  ointment for the scrapes, cuts and abrasions.
